# Patient Record
Sex: Male | Born: 1955 | Race: White | Hispanic: No | Marital: Married | Smoking: Current every day smoker
Health system: Southern US, Community
[De-identification: ages and names within clinical notes are randomized; demographics above are authoritative.]

## PROBLEM LIST (undated history)

## (undated) DIAGNOSIS — I10 Essential (primary) hypertension: Secondary | ICD-10-CM

## (undated) DIAGNOSIS — W132XXA Fall from, out of or through roof, initial encounter: Secondary | ICD-10-CM

## (undated) DIAGNOSIS — M1990 Unspecified osteoarthritis, unspecified site: Secondary | ICD-10-CM

## (undated) DIAGNOSIS — T07XXXA Unspecified multiple injuries, initial encounter: Secondary | ICD-10-CM

## (undated) DIAGNOSIS — J449 Chronic obstructive pulmonary disease, unspecified: Secondary | ICD-10-CM

## (undated) DIAGNOSIS — S060X9A Concussion with loss of consciousness of unspecified duration, initial encounter: Secondary | ICD-10-CM

## (undated) DIAGNOSIS — I219 Acute myocardial infarction, unspecified: Secondary | ICD-10-CM

## (undated) HISTORY — PX: HERNIA REPAIR: SHX51

## (undated) HISTORY — PX: ABDOMINAL HERNIA REPAIR: SHX539

## (undated) HISTORY — PX: CORONARY ANGIOPLASTY WITH STENT PLACEMENT: SHX49

---

## 2017-03-22 DIAGNOSIS — I10 Essential (primary) hypertension: Secondary | ICD-10-CM | POA: Diagnosis not present

## 2017-03-22 DIAGNOSIS — R632 Polyphagia: Secondary | ICD-10-CM | POA: Diagnosis not present

## 2017-03-22 DIAGNOSIS — M539 Dorsopathy, unspecified: Secondary | ICD-10-CM | POA: Diagnosis not present

## 2017-03-22 DIAGNOSIS — M069 Rheumatoid arthritis, unspecified: Secondary | ICD-10-CM | POA: Diagnosis not present

## 2017-04-02 DIAGNOSIS — Z125 Encounter for screening for malignant neoplasm of prostate: Secondary | ICD-10-CM | POA: Diagnosis not present

## 2017-04-02 DIAGNOSIS — Z6821 Body mass index (BMI) 21.0-21.9, adult: Secondary | ICD-10-CM | POA: Diagnosis not present

## 2017-04-02 DIAGNOSIS — Z1211 Encounter for screening for malignant neoplasm of colon: Secondary | ICD-10-CM | POA: Diagnosis not present

## 2017-04-02 DIAGNOSIS — Z13820 Encounter for screening for osteoporosis: Secondary | ICD-10-CM | POA: Diagnosis not present

## 2017-04-02 DIAGNOSIS — Z0001 Encounter for general adult medical examination with abnormal findings: Secondary | ICD-10-CM | POA: Diagnosis not present

## 2017-04-02 DIAGNOSIS — R5381 Other malaise: Secondary | ICD-10-CM | POA: Diagnosis not present

## 2017-04-02 DIAGNOSIS — I10 Essential (primary) hypertension: Secondary | ICD-10-CM | POA: Diagnosis not present

## 2017-04-11 DIAGNOSIS — K5900 Constipation, unspecified: Secondary | ICD-10-CM | POA: Diagnosis not present

## 2017-04-11 DIAGNOSIS — R1084 Generalized abdominal pain: Secondary | ICD-10-CM | POA: Diagnosis not present

## 2017-04-11 DIAGNOSIS — R252 Cramp and spasm: Secondary | ICD-10-CM | POA: Diagnosis not present

## 2017-04-11 DIAGNOSIS — B9681 Helicobacter pylori [H. pylori] as the cause of diseases classified elsewhere: Secondary | ICD-10-CM | POA: Diagnosis not present

## 2017-04-11 DIAGNOSIS — M539 Dorsopathy, unspecified: Secondary | ICD-10-CM | POA: Diagnosis not present

## 2017-04-11 DIAGNOSIS — K219 Gastro-esophageal reflux disease without esophagitis: Secondary | ICD-10-CM | POA: Diagnosis not present

## 2017-04-16 DIAGNOSIS — M539 Dorsopathy, unspecified: Secondary | ICD-10-CM | POA: Diagnosis not present

## 2017-04-16 DIAGNOSIS — G4700 Insomnia, unspecified: Secondary | ICD-10-CM | POA: Diagnosis not present

## 2017-04-16 DIAGNOSIS — R252 Cramp and spasm: Secondary | ICD-10-CM | POA: Diagnosis not present

## 2017-04-16 DIAGNOSIS — Z1331 Encounter for screening for depression: Secondary | ICD-10-CM | POA: Diagnosis not present

## 2017-04-23 DIAGNOSIS — M539 Dorsopathy, unspecified: Secondary | ICD-10-CM | POA: Diagnosis not present

## 2017-04-23 DIAGNOSIS — H6123 Impacted cerumen, bilateral: Secondary | ICD-10-CM | POA: Diagnosis not present

## 2017-04-23 DIAGNOSIS — Z1331 Encounter for screening for depression: Secondary | ICD-10-CM | POA: Diagnosis not present

## 2017-04-23 DIAGNOSIS — G4700 Insomnia, unspecified: Secondary | ICD-10-CM | POA: Diagnosis not present

## 2017-04-23 DIAGNOSIS — R252 Cramp and spasm: Secondary | ICD-10-CM | POA: Diagnosis not present

## 2017-04-23 DIAGNOSIS — H6122 Impacted cerumen, left ear: Secondary | ICD-10-CM | POA: Diagnosis not present

## 2017-05-01 DIAGNOSIS — E291 Testicular hypofunction: Secondary | ICD-10-CM | POA: Diagnosis not present

## 2017-05-01 DIAGNOSIS — I10 Essential (primary) hypertension: Secondary | ICD-10-CM | POA: Diagnosis not present

## 2017-05-01 DIAGNOSIS — N401 Enlarged prostate with lower urinary tract symptoms: Secondary | ICD-10-CM | POA: Diagnosis not present

## 2017-05-01 DIAGNOSIS — R252 Cramp and spasm: Secondary | ICD-10-CM | POA: Diagnosis not present

## 2017-05-01 DIAGNOSIS — M539 Dorsopathy, unspecified: Secondary | ICD-10-CM | POA: Diagnosis not present

## 2017-05-01 DIAGNOSIS — Z1331 Encounter for screening for depression: Secondary | ICD-10-CM | POA: Diagnosis not present

## 2017-05-14 DIAGNOSIS — R252 Cramp and spasm: Secondary | ICD-10-CM | POA: Diagnosis not present

## 2017-05-14 DIAGNOSIS — G4700 Insomnia, unspecified: Secondary | ICD-10-CM | POA: Diagnosis not present

## 2017-05-14 DIAGNOSIS — M539 Dorsopathy, unspecified: Secondary | ICD-10-CM | POA: Diagnosis not present

## 2017-05-14 DIAGNOSIS — N401 Enlarged prostate with lower urinary tract symptoms: Secondary | ICD-10-CM | POA: Diagnosis not present

## 2017-05-29 DIAGNOSIS — M79604 Pain in right leg: Secondary | ICD-10-CM | POA: Diagnosis not present

## 2017-05-29 DIAGNOSIS — G8929 Other chronic pain: Secondary | ICD-10-CM | POA: Diagnosis not present

## 2017-05-29 DIAGNOSIS — Z6820 Body mass index (BMI) 20.0-20.9, adult: Secondary | ICD-10-CM | POA: Diagnosis not present

## 2017-05-29 DIAGNOSIS — M539 Dorsopathy, unspecified: Secondary | ICD-10-CM | POA: Diagnosis not present

## 2017-06-19 DIAGNOSIS — F419 Anxiety disorder, unspecified: Secondary | ICD-10-CM | POA: Diagnosis not present

## 2017-06-25 DIAGNOSIS — F419 Anxiety disorder, unspecified: Secondary | ICD-10-CM | POA: Diagnosis not present

## 2017-06-26 DIAGNOSIS — I10 Essential (primary) hypertension: Secondary | ICD-10-CM | POA: Diagnosis not present

## 2017-06-26 DIAGNOSIS — G4700 Insomnia, unspecified: Secondary | ICD-10-CM | POA: Diagnosis not present

## 2017-06-26 DIAGNOSIS — Z1331 Encounter for screening for depression: Secondary | ICD-10-CM | POA: Diagnosis not present

## 2017-06-26 DIAGNOSIS — M79604 Pain in right leg: Secondary | ICD-10-CM | POA: Diagnosis not present

## 2017-06-26 DIAGNOSIS — E291 Testicular hypofunction: Secondary | ICD-10-CM | POA: Diagnosis not present

## 2017-06-26 DIAGNOSIS — M539 Dorsopathy, unspecified: Secondary | ICD-10-CM | POA: Diagnosis not present

## 2017-07-02 DIAGNOSIS — F419 Anxiety disorder, unspecified: Secondary | ICD-10-CM | POA: Diagnosis not present

## 2017-07-03 DIAGNOSIS — F419 Anxiety disorder, unspecified: Secondary | ICD-10-CM | POA: Diagnosis not present

## 2017-07-10 DIAGNOSIS — R05 Cough: Secondary | ICD-10-CM | POA: Diagnosis not present

## 2017-07-10 DIAGNOSIS — F17200 Nicotine dependence, unspecified, uncomplicated: Secondary | ICD-10-CM | POA: Diagnosis not present

## 2017-07-10 DIAGNOSIS — F419 Anxiety disorder, unspecified: Secondary | ICD-10-CM | POA: Diagnosis not present

## 2017-07-17 DIAGNOSIS — F419 Anxiety disorder, unspecified: Secondary | ICD-10-CM | POA: Diagnosis not present

## 2017-07-26 DIAGNOSIS — G4700 Insomnia, unspecified: Secondary | ICD-10-CM | POA: Diagnosis not present

## 2017-07-26 DIAGNOSIS — M539 Dorsopathy, unspecified: Secondary | ICD-10-CM | POA: Diagnosis not present

## 2017-07-26 DIAGNOSIS — G8929 Other chronic pain: Secondary | ICD-10-CM | POA: Diagnosis not present

## 2017-07-26 DIAGNOSIS — M79604 Pain in right leg: Secondary | ICD-10-CM | POA: Diagnosis not present

## 2017-08-27 DIAGNOSIS — Z1331 Encounter for screening for depression: Secondary | ICD-10-CM | POA: Diagnosis not present

## 2017-08-27 DIAGNOSIS — M545 Low back pain: Secondary | ICD-10-CM | POA: Diagnosis not present

## 2017-08-27 DIAGNOSIS — N401 Enlarged prostate with lower urinary tract symptoms: Secondary | ICD-10-CM | POA: Diagnosis not present

## 2017-08-27 DIAGNOSIS — R079 Chest pain, unspecified: Secondary | ICD-10-CM | POA: Diagnosis not present

## 2017-08-27 DIAGNOSIS — R252 Cramp and spasm: Secondary | ICD-10-CM | POA: Diagnosis not present

## 2017-09-25 DIAGNOSIS — Z681 Body mass index (BMI) 19 or less, adult: Secondary | ICD-10-CM | POA: Diagnosis not present

## 2017-09-25 DIAGNOSIS — R1110 Vomiting, unspecified: Secondary | ICD-10-CM | POA: Diagnosis not present

## 2017-09-25 DIAGNOSIS — R109 Unspecified abdominal pain: Secondary | ICD-10-CM | POA: Diagnosis not present

## 2017-09-25 DIAGNOSIS — R112 Nausea with vomiting, unspecified: Secondary | ICD-10-CM | POA: Diagnosis not present

## 2017-09-25 DIAGNOSIS — N401 Enlarged prostate with lower urinary tract symptoms: Secondary | ICD-10-CM | POA: Diagnosis not present

## 2017-09-25 DIAGNOSIS — R1011 Right upper quadrant pain: Secondary | ICD-10-CM | POA: Diagnosis not present

## 2017-09-25 DIAGNOSIS — M545 Low back pain: Secondary | ICD-10-CM | POA: Diagnosis not present

## 2017-09-27 DIAGNOSIS — R1011 Right upper quadrant pain: Secondary | ICD-10-CM | POA: Diagnosis not present

## 2017-09-27 DIAGNOSIS — R112 Nausea with vomiting, unspecified: Secondary | ICD-10-CM | POA: Diagnosis not present

## 2017-09-27 DIAGNOSIS — M545 Low back pain: Secondary | ICD-10-CM | POA: Diagnosis not present

## 2017-09-27 DIAGNOSIS — G8929 Other chronic pain: Secondary | ICD-10-CM | POA: Diagnosis not present

## 2017-09-27 DIAGNOSIS — Z23 Encounter for immunization: Secondary | ICD-10-CM | POA: Diagnosis not present

## 2017-10-22 DIAGNOSIS — R450 Nervousness: Secondary | ICD-10-CM | POA: Diagnosis not present

## 2017-10-22 DIAGNOSIS — Z681 Body mass index (BMI) 19 or less, adult: Secondary | ICD-10-CM | POA: Diagnosis not present

## 2017-10-22 DIAGNOSIS — G8929 Other chronic pain: Secondary | ICD-10-CM | POA: Diagnosis not present

## 2017-10-22 DIAGNOSIS — M545 Low back pain: Secondary | ICD-10-CM | POA: Diagnosis not present

## 2017-11-23 DIAGNOSIS — R450 Nervousness: Secondary | ICD-10-CM | POA: Diagnosis not present

## 2017-11-23 DIAGNOSIS — R300 Dysuria: Secondary | ICD-10-CM | POA: Diagnosis not present

## 2017-11-23 DIAGNOSIS — M545 Low back pain: Secondary | ICD-10-CM | POA: Diagnosis not present

## 2017-11-23 DIAGNOSIS — R197 Diarrhea, unspecified: Secondary | ICD-10-CM | POA: Diagnosis not present

## 2017-12-21 DIAGNOSIS — M545 Low back pain: Secondary | ICD-10-CM | POA: Diagnosis not present

## 2017-12-21 DIAGNOSIS — G8929 Other chronic pain: Secondary | ICD-10-CM | POA: Diagnosis not present

## 2017-12-21 DIAGNOSIS — Z6820 Body mass index (BMI) 20.0-20.9, adult: Secondary | ICD-10-CM | POA: Diagnosis not present

## 2017-12-21 DIAGNOSIS — I10 Essential (primary) hypertension: Secondary | ICD-10-CM | POA: Diagnosis not present

## 2017-12-28 DIAGNOSIS — Z681 Body mass index (BMI) 19 or less, adult: Secondary | ICD-10-CM | POA: Diagnosis not present

## 2017-12-28 DIAGNOSIS — I10 Essential (primary) hypertension: Secondary | ICD-10-CM | POA: Diagnosis not present

## 2017-12-28 DIAGNOSIS — G8929 Other chronic pain: Secondary | ICD-10-CM | POA: Diagnosis not present

## 2017-12-28 DIAGNOSIS — M545 Low back pain: Secondary | ICD-10-CM | POA: Diagnosis not present

## 2018-01-16 DIAGNOSIS — M545 Low back pain: Secondary | ICD-10-CM | POA: Diagnosis not present

## 2018-01-16 DIAGNOSIS — Z1331 Encounter for screening for depression: Secondary | ICD-10-CM | POA: Diagnosis not present

## 2018-01-16 DIAGNOSIS — I10 Essential (primary) hypertension: Secondary | ICD-10-CM | POA: Diagnosis not present

## 2018-01-16 DIAGNOSIS — G8929 Other chronic pain: Secondary | ICD-10-CM | POA: Diagnosis not present

## 2018-01-17 ENCOUNTER — Other Ambulatory Visit: Payer: Self-pay

## 2018-01-17 ENCOUNTER — Emergency Department (HOSPITAL_COMMUNITY): Payer: BLUE CROSS/BLUE SHIELD

## 2018-01-17 ENCOUNTER — Inpatient Hospital Stay (HOSPITAL_COMMUNITY): Payer: BLUE CROSS/BLUE SHIELD

## 2018-01-17 ENCOUNTER — Encounter (HOSPITAL_COMMUNITY): Payer: Self-pay | Admitting: Emergency Medicine

## 2018-01-17 ENCOUNTER — Inpatient Hospital Stay (HOSPITAL_COMMUNITY)
Admission: EM | Admit: 2018-01-17 | Discharge: 2018-01-26 | DRG: 958 | Disposition: A | Discharge status: Home Health | Payer: BLUE CROSS/BLUE SHIELD | Attending: Student | Admitting: Student

## 2018-01-17 DIAGNOSIS — M159 Polyosteoarthritis, unspecified: Secondary | ICD-10-CM | POA: Diagnosis not present

## 2018-01-17 DIAGNOSIS — R52 Pain, unspecified: Secondary | ICD-10-CM | POA: Diagnosis not present

## 2018-01-17 DIAGNOSIS — Z955 Presence of coronary angioplasty implant and graft: Secondary | ICD-10-CM

## 2018-01-17 DIAGNOSIS — S060XAA Concussion with loss of consciousness status unknown, initial encounter: Secondary | ICD-10-CM

## 2018-01-17 DIAGNOSIS — S32810A Multiple fractures of pelvis with stable disruption of pelvic ring, initial encounter for closed fracture: Secondary | ICD-10-CM | POA: Diagnosis present

## 2018-01-17 DIAGNOSIS — I2510 Atherosclerotic heart disease of native coronary artery without angina pectoris: Secondary | ICD-10-CM | POA: Diagnosis not present

## 2018-01-17 DIAGNOSIS — R05 Cough: Secondary | ICD-10-CM | POA: Diagnosis not present

## 2018-01-17 DIAGNOSIS — J939 Pneumothorax, unspecified: Secondary | ICD-10-CM | POA: Diagnosis not present

## 2018-01-17 DIAGNOSIS — Z9689 Presence of other specified functional implants: Secondary | ICD-10-CM

## 2018-01-17 DIAGNOSIS — I252 Old myocardial infarction: Secondary | ICD-10-CM | POA: Diagnosis not present

## 2018-01-17 DIAGNOSIS — M150 Primary generalized (osteo)arthritis: Secondary | ICD-10-CM | POA: Diagnosis present

## 2018-01-17 DIAGNOSIS — T07XXXA Unspecified multiple injuries, initial encounter: Secondary | ICD-10-CM | POA: Diagnosis not present

## 2018-01-17 DIAGNOSIS — S3210XA Unspecified fracture of sacrum, initial encounter for closed fracture: Secondary | ICD-10-CM | POA: Diagnosis not present

## 2018-01-17 DIAGNOSIS — W132XXA Fall from, out of or through roof, initial encounter: Secondary | ICD-10-CM | POA: Diagnosis present

## 2018-01-17 DIAGNOSIS — S2232XA Fracture of one rib, left side, initial encounter for closed fracture: Secondary | ICD-10-CM | POA: Diagnosis not present

## 2018-01-17 DIAGNOSIS — S2242XA Multiple fractures of ribs, left side, initial encounter for closed fracture: Secondary | ICD-10-CM | POA: Diagnosis not present

## 2018-01-17 DIAGNOSIS — S329XXA Fracture of unspecified parts of lumbosacral spine and pelvis, initial encounter for closed fracture: Secondary | ICD-10-CM

## 2018-01-17 DIAGNOSIS — S42125A Nondisplaced fracture of acromial process, left shoulder, initial encounter for closed fracture: Secondary | ICD-10-CM | POA: Diagnosis present

## 2018-01-17 DIAGNOSIS — F17210 Nicotine dependence, cigarettes, uncomplicated: Secondary | ICD-10-CM | POA: Diagnosis present

## 2018-01-17 DIAGNOSIS — Z634 Disappearance and death of family member: Secondary | ICD-10-CM | POA: Diagnosis not present

## 2018-01-17 DIAGNOSIS — Y92038 Other place in apartment as the place of occurrence of the external cause: Secondary | ICD-10-CM | POA: Diagnosis not present

## 2018-01-17 DIAGNOSIS — I10 Essential (primary) hypertension: Secondary | ICD-10-CM | POA: Diagnosis present

## 2018-01-17 DIAGNOSIS — S42122A Displaced fracture of acromial process, left shoulder, initial encounter for closed fracture: Secondary | ICD-10-CM | POA: Diagnosis not present

## 2018-01-17 DIAGNOSIS — S22019A Unspecified fracture of first thoracic vertebra, initial encounter for closed fracture: Secondary | ICD-10-CM | POA: Diagnosis not present

## 2018-01-17 DIAGNOSIS — Z79899 Other long term (current) drug therapy: Secondary | ICD-10-CM | POA: Diagnosis not present

## 2018-01-17 DIAGNOSIS — S060X9A Concussion with loss of consciousness of unspecified duration, initial encounter: Secondary | ICD-10-CM

## 2018-01-17 DIAGNOSIS — R4182 Altered mental status, unspecified: Secondary | ICD-10-CM | POA: Diagnosis not present

## 2018-01-17 DIAGNOSIS — S272XXA Traumatic hemopneumothorax, initial encounter: Secondary | ICD-10-CM | POA: Diagnosis not present

## 2018-01-17 DIAGNOSIS — S0990XA Unspecified injury of head, initial encounter: Secondary | ICD-10-CM | POA: Diagnosis not present

## 2018-01-17 DIAGNOSIS — E871 Hypo-osmolality and hyponatremia: Secondary | ICD-10-CM | POA: Diagnosis present

## 2018-01-17 DIAGNOSIS — E876 Hypokalemia: Secondary | ICD-10-CM | POA: Diagnosis not present

## 2018-01-17 DIAGNOSIS — Y990 Civilian activity done for income or pay: Secondary | ICD-10-CM | POA: Diagnosis not present

## 2018-01-17 DIAGNOSIS — S32502D Unspecified fracture of left pubis, subsequent encounter for fracture with routine healing: Secondary | ICD-10-CM | POA: Diagnosis not present

## 2018-01-17 DIAGNOSIS — S3993XA Unspecified injury of pelvis, initial encounter: Secondary | ICD-10-CM | POA: Diagnosis not present

## 2018-01-17 DIAGNOSIS — K219 Gastro-esophageal reflux disease without esophagitis: Secondary | ICD-10-CM | POA: Diagnosis present

## 2018-01-17 DIAGNOSIS — T1490XA Injury, unspecified, initial encounter: Secondary | ICD-10-CM

## 2018-01-17 DIAGNOSIS — Z4682 Encounter for fitting and adjustment of non-vascular catheter: Secondary | ICD-10-CM | POA: Diagnosis not present

## 2018-01-17 DIAGNOSIS — D72829 Elevated white blood cell count, unspecified: Secondary | ICD-10-CM | POA: Diagnosis not present

## 2018-01-17 DIAGNOSIS — R079 Chest pain, unspecified: Secondary | ICD-10-CM | POA: Diagnosis not present

## 2018-01-17 DIAGNOSIS — R918 Other nonspecific abnormal finding of lung field: Secondary | ICD-10-CM | POA: Diagnosis not present

## 2018-01-17 DIAGNOSIS — S199XXA Unspecified injury of neck, initial encounter: Secondary | ICD-10-CM | POA: Diagnosis not present

## 2018-01-17 DIAGNOSIS — S32502A Unspecified fracture of left pubis, initial encounter for closed fracture: Secondary | ICD-10-CM | POA: Diagnosis not present

## 2018-01-17 DIAGNOSIS — S42102A Fracture of unspecified part of scapula, left shoulder, initial encounter for closed fracture: Secondary | ICD-10-CM | POA: Diagnosis not present

## 2018-01-17 DIAGNOSIS — S270XXA Traumatic pneumothorax, initial encounter: Secondary | ICD-10-CM | POA: Diagnosis not present

## 2018-01-17 DIAGNOSIS — M549 Dorsalgia, unspecified: Secondary | ICD-10-CM | POA: Diagnosis present

## 2018-01-17 DIAGNOSIS — R0602 Shortness of breath: Secondary | ICD-10-CM

## 2018-01-17 DIAGNOSIS — J449 Chronic obstructive pulmonary disease, unspecified: Secondary | ICD-10-CM | POA: Diagnosis present

## 2018-01-17 DIAGNOSIS — R0781 Pleurodynia: Secondary | ICD-10-CM | POA: Diagnosis not present

## 2018-01-17 DIAGNOSIS — R404 Transient alteration of awareness: Secondary | ICD-10-CM | POA: Diagnosis not present

## 2018-01-17 DIAGNOSIS — S32811A Multiple fractures of pelvis with unstable disruption of pelvic ring, initial encounter for closed fracture: Secondary | ICD-10-CM | POA: Diagnosis not present

## 2018-01-17 DIAGNOSIS — J984 Other disorders of lung: Secondary | ICD-10-CM | POA: Diagnosis not present

## 2018-01-17 HISTORY — DX: Concussion with loss of consciousness of unspecified duration, initial encounter: S06.0X9A

## 2018-01-17 HISTORY — DX: Fall from, out of or through roof, initial encounter: W13.2XXA

## 2018-01-17 HISTORY — PX: CHEST TUBE INSERTION: SHX231

## 2018-01-17 HISTORY — DX: Essential (primary) hypertension: I10

## 2018-01-17 HISTORY — DX: Unspecified osteoarthritis, unspecified site: M19.90

## 2018-01-17 HISTORY — DX: Unspecified multiple injuries, initial encounter: T07.XXXA

## 2018-01-17 HISTORY — DX: Acute myocardial infarction, unspecified: I21.9

## 2018-01-17 HISTORY — DX: Chronic obstructive pulmonary disease, unspecified: J44.9

## 2018-01-17 HISTORY — DX: Concussion with loss of consciousness status unknown, initial encounter: S06.0XAA

## 2018-01-17 LAB — I-STAT CG4 LACTIC ACID, ED: Lactic Acid, Venous: 2.02 mmol/L (ref 0.5–1.9)

## 2018-01-17 LAB — COMPREHENSIVE METABOLIC PANEL
ALT: 71 U/L — ABNORMAL HIGH (ref 0–44)
AST: 114 U/L — ABNORMAL HIGH (ref 15–41)
Albumin: 3.3 g/dL — ABNORMAL LOW (ref 3.5–5.0)
Alkaline Phosphatase: 222 U/L — ABNORMAL HIGH (ref 38–126)
Anion gap: 12 (ref 5–15)
BUN: 8 mg/dL (ref 8–23)
CO2: 23 mmol/L (ref 22–32)
Calcium: 8.8 mg/dL — ABNORMAL LOW (ref 8.9–10.3)
Chloride: 95 mmol/L — ABNORMAL LOW (ref 98–111)
Creatinine, Ser: 0.84 mg/dL (ref 0.61–1.24)
GFR calc Af Amer: >60 mL/min (ref >60)
GFR calc non Af Amer: >60 mL/min (ref >60)
Glucose, Bld: 111 mg/dL — ABNORMAL HIGH (ref 70–99)
Potassium: 3.3 mmol/L — ABNORMAL LOW (ref 3.5–5.1)
Sodium: 130 mmol/L — ABNORMAL LOW (ref 135–145)
Total Bilirubin: 0.3 mg/dL (ref 0.3–1.2)
Total Protein: 6.8 g/dL (ref 6.5–8.1)

## 2018-01-17 LAB — I-STAT CHEM 8, ED
BUN: 10 mg/dL (ref 8–23)
Calcium, Ion: 1.06 mmol/L — ABNORMAL LOW (ref 1.15–1.40)
Chloride: 95 mmol/L — ABNORMAL LOW (ref 98–111)
Creatinine, Ser: 0.8 mg/dL (ref 0.61–1.24)
Glucose, Bld: 109 mg/dL — ABNORMAL HIGH (ref 70–99)
HCT: 44 % (ref 39.0–52.0)
Hemoglobin: 15 g/dL (ref 13.0–17.0)
Potassium: 3.3 mmol/L — ABNORMAL LOW (ref 3.5–5.1)
Sodium: 131 mmol/L — ABNORMAL LOW (ref 135–145)
TCO2: 26 mmol/L (ref 22–32)

## 2018-01-17 LAB — URINALYSIS, ROUTINE W REFLEX MICROSCOPIC
Bacteria, UA: NONE SEEN
Bilirubin Urine: NEGATIVE
Glucose, UA: NEGATIVE mg/dL
Ketones, ur: NEGATIVE mg/dL
Leukocytes, UA: NEGATIVE
Nitrite: NEGATIVE
Protein, ur: NEGATIVE mg/dL
Specific Gravity, Urine: 1.02 (ref 1.005–1.030)
pH: 7 (ref 5.0–8.0)

## 2018-01-17 LAB — CBC
HCT: 41.4 % (ref 39.0–52.0)
Hemoglobin: 13.6 g/dL (ref 13.0–17.0)
MCH: 31.1 pg (ref 26.0–34.0)
MCHC: 32.9 g/dL (ref 30.0–36.0)
MCV: 94.5 fL (ref 80.0–100.0)
Platelets: 396 10*3/uL (ref 150–400)
RBC: 4.38 MIL/uL (ref 4.22–5.81)
RDW: 13.9 % (ref 11.5–15.5)
WBC: 29 10*3/uL — ABNORMAL HIGH (ref 4.0–10.5)
nRBC: 0 % (ref 0.0–0.2)

## 2018-01-17 LAB — ABO/RH: ABO/RH(D): A POS

## 2018-01-17 LAB — TYPE AND SCREEN
ABO/RH(D): A POS
Antibody Screen: NEGATIVE

## 2018-01-17 LAB — PROTIME-INR
INR: 0.94
Prothrombin Time: 12.5 seconds (ref 11.4–15.2)

## 2018-01-17 LAB — ETHANOL: Alcohol, Ethyl (B): <10 mg/dL (ref <10)

## 2018-01-17 LAB — MRSA PCR SCREENING: MRSA by PCR: NEGATIVE

## 2018-01-17 LAB — CDS SEROLOGY

## 2018-01-17 MED ORDER — SERTRALINE HCL 50 MG PO TABS
25.0000 mg | ORAL_TABLET | Freq: Every morning | ORAL | Status: DC
  Administered 2018-01-18 – 2018-01-26 (×9): 25 mg via ORAL
  Filled 2018-01-17 (×9): qty 1

## 2018-01-17 MED ORDER — FENTANYL CITRATE (PF) 100 MCG/2ML IJ SOLN
INTRAMUSCULAR | Status: AC
  Filled 2018-01-17: qty 2

## 2018-01-17 MED ORDER — SODIUM CHLORIDE 0.9 % IV SOLN
INTRAVENOUS | Status: AC | PRN
  Administered 2018-01-17: 1000 mL via INTRAVENOUS

## 2018-01-17 MED ORDER — PANTOPRAZOLE SODIUM 40 MG IV SOLR
40.0000 mg | Freq: Every day | INTRAVENOUS | Status: DC
  Administered 2018-01-17: 40 mg via INTRAVENOUS
  Filled 2018-01-17: qty 40

## 2018-01-17 MED ORDER — LIDOCAINE HCL 1 % IJ SOLN: 20.0000 mL | Freq: Once | INTRAMUSCULAR | Status: DC

## 2018-01-17 MED ORDER — FENTANYL CITRATE (PF) 100 MCG/2ML IJ SOLN
INTRAMUSCULAR | Status: AC | PRN
  Administered 2018-01-17 (×3): 50 ug via INTRAVENOUS

## 2018-01-17 MED ORDER — ONDANSETRON HCL 4 MG/2ML IJ SOLN: 4.0000 mg | Freq: Four times a day (QID) | INTRAMUSCULAR | Status: DC | PRN

## 2018-01-17 MED ORDER — ENOXAPARIN SODIUM 40 MG/0.4ML ~~LOC~~ SOLN
40.0000 mg | SUBCUTANEOUS | Status: DC
  Administered 2018-01-18 – 2018-01-22 (×5): 40 mg via SUBCUTANEOUS
  Filled 2018-01-17 (×5): qty 0.4

## 2018-01-17 MED ORDER — FENTANYL CITRATE (PF) 100 MCG/2ML IJ SOLN
50.0000 ug | INTRAMUSCULAR | Status: DC | PRN
  Administered 2018-01-17: 50 ug via INTRAVENOUS
  Filled 2018-01-17: qty 2

## 2018-01-17 MED ORDER — IOHEXOL 300 MG/ML  SOLN
100.0000 mL | Freq: Once | INTRAMUSCULAR | Status: AC | PRN
  Administered 2018-01-17: 100 mL via INTRAVENOUS

## 2018-01-17 MED ORDER — POTASSIUM CHLORIDE CRYS ER 20 MEQ PO TBCR
20.0000 meq | EXTENDED_RELEASE_TABLET | Freq: Two times a day (BID) | ORAL | Status: AC
  Administered 2018-01-17 – 2018-01-18 (×3): 20 meq via ORAL
  Filled 2018-01-17 (×3): qty 1

## 2018-01-17 MED ORDER — AMLODIPINE BESYLATE 10 MG PO TABS
10.0000 mg | ORAL_TABLET | Freq: Every day | ORAL | Status: DC
  Administered 2018-01-18 – 2018-01-26 (×9): 10 mg via ORAL
  Filled 2018-01-17 (×2): qty 1
  Filled 2018-01-17: qty 2
  Filled 2018-01-17: qty 1
  Filled 2018-01-17 (×3): qty 2
  Filled 2018-01-17: qty 1
  Filled 2018-01-17: qty 2

## 2018-01-17 MED ORDER — METOPROLOL TARTRATE 5 MG/5ML IV SOLN: 5.0000 mg | Freq: Four times a day (QID) | INTRAVENOUS | Status: DC | PRN

## 2018-01-17 MED ORDER — FENTANYL CITRATE (PF) 100 MCG/2ML IJ SOLN
INTRAMUSCULAR | Status: AC | PRN
  Administered 2018-01-17: 50 ug via INTRAVENOUS

## 2018-01-17 MED ORDER — MORPHINE SULFATE (PF) 2 MG/ML IV SOLN
1.0000 mg | INTRAVENOUS | Status: DC | PRN
  Administered 2018-01-17 (×2): 2 mg via INTRAVENOUS
  Administered 2018-01-18 (×2): 4 mg via INTRAVENOUS
  Filled 2018-01-17: qty 2
  Filled 2018-01-17 (×2): qty 1
  Filled 2018-01-17: qty 2

## 2018-01-17 MED ORDER — HYDROCHLOROTHIAZIDE 25 MG PO TABS: 25.0000 mg | ORAL_TABLET | Freq: Every morning | ORAL | Status: DC

## 2018-01-17 MED ORDER — ACETAMINOPHEN 325 MG PO TABS
650.0000 mg | ORAL_TABLET | ORAL | Status: DC | PRN
  Administered 2018-01-17 – 2018-01-19 (×5): 650 mg via ORAL
  Filled 2018-01-17 (×5): qty 2

## 2018-01-17 MED ORDER — ONDANSETRON 4 MG PO TBDP: 4.0000 mg | ORAL_TABLET | Freq: Four times a day (QID) | ORAL | Status: DC | PRN

## 2018-01-17 MED ORDER — OXYCODONE HCL 5 MG PO TABS
5.0000 mg | ORAL_TABLET | ORAL | Status: DC | PRN
  Administered 2018-01-17 – 2018-01-18 (×4): 10 mg via ORAL
  Filled 2018-01-17 (×4): qty 2

## 2018-01-17 MED ORDER — ATENOLOL 25 MG PO TABS
25.0000 mg | ORAL_TABLET | Freq: Every day | ORAL | Status: DC
  Administered 2018-01-18 – 2018-01-26 (×9): 25 mg via ORAL
  Filled 2018-01-17 (×9): qty 1

## 2018-01-17 MED ORDER — METHOCARBAMOL 500 MG PO TABS
500.0000 mg | ORAL_TABLET | Freq: Four times a day (QID) | ORAL | Status: DC | PRN
  Administered 2018-01-17 – 2018-01-19 (×4): 500 mg via ORAL
  Filled 2018-01-17 (×5): qty 1

## 2018-01-17 MED ORDER — PANTOPRAZOLE SODIUM 40 MG PO TBEC
40.0000 mg | DELAYED_RELEASE_TABLET | Freq: Every day | ORAL | Status: DC
  Administered 2018-01-18 – 2018-01-26 (×9): 40 mg via ORAL
  Filled 2018-01-17 (×9): qty 1

## 2018-01-17 MED ORDER — TRAZODONE HCL 50 MG PO TABS
50.0000 mg | ORAL_TABLET | Freq: Every evening | ORAL | Status: DC | PRN
  Administered 2018-01-17: 50 mg via ORAL
  Filled 2018-01-17 (×2): qty 1

## 2018-01-17 MED ORDER — TAMSULOSIN HCL 0.4 MG PO CAPS
0.4000 mg | ORAL_CAPSULE | Freq: Every day | ORAL | Status: DC
  Administered 2018-01-17 – 2018-01-25 (×8): 0.4 mg via ORAL
  Filled 2018-01-17 (×8): qty 1

## 2018-01-17 MED ORDER — SODIUM CHLORIDE 0.9 % IV SOLN
INTRAVENOUS | Status: DC
  Administered 2018-01-17 – 2018-01-19 (×4): via INTRAVENOUS

## 2018-01-17 MED ORDER — DOCUSATE SODIUM 100 MG PO CAPS
100.0000 mg | ORAL_CAPSULE | Freq: Two times a day (BID) | ORAL | Status: DC
  Administered 2018-01-17 – 2018-01-25 (×16): 100 mg via ORAL
  Filled 2018-01-17 (×17): qty 1

## 2018-01-17 NOTE — Progress Notes (Signed)
Orthopedic Tech Progress Note Patient Details:  Bryce Moore 1955/03/24 [id number]  Ortho Devices Type of Ortho Device: Sling immobilizer Ortho Device/Splint Location: lue Ortho Device/Splint Interventions: Ordered, Application, Adjustment   Post Interventions Patient Tolerated: Well Instructions Provided: Care of device, Adjustment of device   Trinna Post 01/17/2018, 4:06 PM

## 2018-01-17 NOTE — Progress Notes (Signed)
Responded to level 2 page to support staff and and patient.  Patient experienced Fall.  Pt is alert and communicating with staff and not accessible.  Per EMS patient wife was called. Pt was not at home when fall took place. Will continue support as needed.   Bryce Moore, Bryce Moore, [place], [facility], Pager [phone]

## 2018-01-17 NOTE — ED Notes (Addendum)
Cash and check book locked in security. Necklace, clothing and phone remain with pt

## 2018-01-17 NOTE — ED Notes (Signed)
Report given to the floor. 

## 2018-01-17 NOTE — H&P (Signed)
[facility]  Bryce Moore 1955-07-28  [id number].    Requesting MD: Mesner Chief Complaint/Reason for Consult: level 2 trauma - fall from roof  HPI:  Patient is a 63 year old male brought in as a level 2 trauma brought in via EMS after fall from a roof. 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 Patient reports he remembers falling and +LOC. Reports he remembers standing up and his feet starting to slide out from under him. Denies dizziness or chest pain prior to fall. He complains mostly of left chest pain on arrival and then found to have some left shoulder and pelvic pain as well. PMH significant for HTN and CAD with 2 cardiac stents. Patient initially thought to be on blood thinning medications, but confirmed with patient and patient's wife that he is not on any blood thinners. NKDA. Drinks socially. Smokes 1 ppd for 20-25 years. Denies illicit drug use.   Of note it appears in chart review patient also had a mechanical fall back in May in [place], [place]. Patient denies history of other falls.  ROS: Review of Systems  HENT: Negative for hearing loss and tinnitus.   Eyes: Negative for blurred vision and double vision.  Respiratory: Positive for shortness of breath.   Cardiovascular: Positive for chest pain (L chest wall).  Gastrointestinal: Negative for abdominal pain, nausea and vomiting.  Musculoskeletal: Positive for joint pain (L shoulder and L hip). Negative for back pain and neck pain.  Neurological: Positive for loss of consciousness and headaches. Negative for tingling and sensory change.    No family history on file.  Past Medical History:  Diagnosis Date  . Hypertension     Past Surgical History:  Procedure Laterality Date  . CARDIAC CATHETERIZATION     2 stents    Social History:  has no history on file for tobacco, alcohol, and drug.  Allergies:  Allergies not on file  (Not in a hospital admission)   Blood pressure (S) 100/68, pulse 66, temperature 97.7 F (36.5 C), temperature source Temporal, resp. rate 16, height 5' 10.5" (1.791 m), weight 63 kg, SpO2 (S) 93 %. Physical Exam: Physical Exam Constitutional:      General: He is not in acute distress.    Appearance: He is well-developed and normal weight. He is not toxic-appearing.     Interventions: Cervical collar and nasal cannula in place.  HENT:     Head: Normocephalic and atraumatic. No raccoon eyes, Battle's sign, contusion or laceration.     Right Ear: Tympanic membrane, ear canal and external ear normal.     Left Ear: Tympanic membrane, ear canal and external ear normal.     Nose: Nose normal.     Mouth/Throat:     Lips: Pink.     Mouth: Mucous membranes are moist.     Dentition: Has dentures.  Eyes:     General: Lids are normal. No scleral icterus.    Extraocular Movements: Extraocular movements intact.     Conjunctiva/sclera: Conjunctivae normal.     Pupils: Pupils are equal, round, and reactive to light.  Neck:     Musculoskeletal: Neck supple.     Trachea: Phonation normal.  Cardiovascular:     Rate and Rhythm: Normal rate and regular rhythm.     Pulses:          Radial pulses are 2+ on the right side  and 2+ on the left side.       Dorsalis pedis pulses are 1+ on the right side and 1+ on the left side.  Pulmonary:     Effort: Pulmonary effort is normal.     Breath sounds: Normal breath sounds.     Comments: Chest tube present to Left chest with minimal bloody drainage.  Chest:     Chest wall: Tenderness present. No lacerations or crepitus.  Abdominal:     General: Abdomen is flat. Bowel sounds are normal. There is no distension.     Palpations: Abdomen is soft.     Tenderness: There is no abdominal tenderness.     Hernia: No hernia is present.  Genitourinary:    Penis: Normal.      Scrotum/Testes: Normal.  Musculoskeletal:     Comments: TTP of L  shoulder and L hip - ROM of both limited by pain, no ecchymoses or obvious deformity on gross examination  Skin:    General: Skin is warm and dry.     Findings: No abrasion, bruising or laceration.  Neurological:     Mental Status: He is alert and oriented to person, place, and time.     GCS: GCS eye subscore is 4. GCS verbal subscore is 5. GCS motor subscore is 6.     Sensory: Sensation is intact.     Motor: Motor function is intact.  Psychiatric:        Mood and Affect: Mood and affect normal.        Speech: Speech normal.        Behavior: Behavior is cooperative.     Results for orders placed or performed during the hospital encounter of 01/17/18 (from the past 48 hour(s))  I-Stat Chem 8, ED     Status: Abnormal   Collection Time: 01/17/18  1:19 PM  Result Value Ref Range   Sodium 131 (L) 135 - 145 mmol/L   Potassium 3.3 (L) 3.5 - 5.1 mmol/L   Chloride 95 (L) 98 - 111 mmol/L   BUN 10 8 - 23 mg/dL   Creatinine, Ser 2.35 0.61 - 1.24 mg/dL   Glucose, Bld 361 (H) 70 - 99 mg/dL   Calcium, Ion 4.43 (L) 1.15 - 1.40 mmol/L   TCO2 26 22 - 32 mmol/L   Hemoglobin 15.0 13.0 - 17.0 g/dL   HCT 15.4 00.8 - 67.6 %  I-Stat CG4 Lactic Acid, ED     Status: Abnormal   Collection Time: 01/17/18  1:19 PM  Result Value Ref Range   Lactic Acid, Venous 2.02 (HH) 0.5 - 1.9 mmol/L   Comment NOTIFIED PHYSICIAN    Dg Pelvis Portable  Result Date: 01/17/2018 CLINICAL DATA:  Unwitnessed fall from roof, left chest and left shoulder pain EXAM: PORTABLE PELVIS 1-2 VIEWS COMPARISON:  None. FINDINGS: There is slightly displaced fractures of the left pubic ramus post superiorly and inferiorly. There is some disruption of the symphysis pubis on the left. Both hips are normally position with no acute hip fracture. The right ramus appears intact. The SI joints appear corticated. The sacral foramina are unremarkable. IMPRESSION: 1. Comminuted fractures of the left pubic ramus. 2. No other acute fracture.  Electronically Signed   By: Dwyane Dee M.D.   On: 01/17/2018 13:34   Dg Chest Port 1 View  Result Date: 01/17/2018 CLINICAL DATA:  Unwitnessed fall from roof, left-sided chest pain, left shoulder pain EXAM: PORTABLE CHEST 1 VIEW COMPARISON:  Chest x-ray of 07/10/2016 FINDINGS: There  is a left pneumothorax present of approximately 20%. There appear to be a rib fractures involving several lateral ribs possibly the left fourth, fifth, sixth, seventh and eighth ribs although these are not well visualized on the frontal view. There is left chest wall subcutaneous air present. The right lung is clear. Mild cardiomegaly is stable. Old right fifth rib fracture is noted posteriorly. IMPRESSION: 1. Left pneumothorax or possibly 20% with associated left rib fractures probably involving the left fourth through eighth ribs. Left rib detail may be helpful if CT of the chest is not to be performed. 2.      The right lung is clear. 3.      Stable cardiomegaly. Electronically Signed   By: Dwyane DeePaul  Barry M.D.   On: 01/17/2018 13:33   Dg Humerus Left  Result Date: 01/17/2018 CLINICAL DATA:  Unwitnessed fall from roof, left-sided chest and shoulder pain EXAM: LEFT HUMERUS - 2+ VIEW COMPARISON:  None. FINDINGS: There does appear to be a fracture of the acromion of the left shoulder with slight widening of the [facility] joint. The acromion is also somewhat downward sloping. Left humeral head is intact and normally position and the left humerus appears intact. Subcutaneous left chest wall air is noted. The left pneumothorax again is noted. IMPRESSION: 1. Nondisplaced fracture of the left acromion with slightly widened left AC joint. CT may be helpful to assess further if warranted. 2. No other fracture is seen. 3. Left pneumothorax is noted with left chest wall subcutaneous air. Adjacent left rib fractures are not as well seen. Electronically Signed   By: Dwyane DeePaul  Barry M.D.   On: 01/17/2018 13:36      Assessment/Plan Fall from  roof Concussion - SLP for cognitive eval L T1 TVP fx - no back pain, PT/OT L rib fractures with HPTX - s/p chest tube placement in ED, pain control, IS, pulm toilet LC2 pelvic fracture - per ortho, TDWB; if severe pain with mobilization may require surgical fixation L shoulder fracture - per ortho, sling, NWB LUE Hyperbilirubinemia - tbili normal, AST/ALT mildly elevated, ALP 222 Hyponatremia - 130, NS ordered, repeat in AM HTN - prn lopressor Hx of CAD s/p stent x2  FEN: reg diet, IVF VTE: SCDs, can start lovenox tomorrow ID: no abx indicated at this time  Wells GuilesKelly Rayburn, [facility] 01/17/2018, 1:48 PM Pager: [phone]

## 2018-01-17 NOTE — ED Notes (Signed)
Help get patient undress on the monitor did manual blood pressure patient is resting with nurses and doctors at bedside

## 2018-01-17 NOTE — Consult Note (Signed)
Reason for Consult:Polytrauma Referring Physician: J Mesner  Atilano InaDanny R Moore is an 63 y.o. male.  HPI: Bryce Moore apparently fell from a roof or ladder. He was last seen on the roof (2nd story) and was later found wandering on the ground confused and covered in brush. He was brought in as a level 2 trauma activation c/o chest pain. When asked he admitted to left shoulder pain. He was not aware of what had happened or where he was.  Past Medical History:  Diagnosis Date  . Hypertension     Past Surgical History:  Procedure Laterality Date  . CARDIAC CATHETERIZATION     2 stents    No family history on file.  Social History:  has no history on file for tobacco, alcohol, and drug.  Allergies: Allergies not on file  Medications: I have reviewed the patient's current medications.  Results for orders placed or performed during the hospital encounter of 01/17/18 (from the past 48 hour(s))  I-Stat Chem 8, ED     Status: Abnormal   Collection Time: 01/17/18  1:19 PM  Result Value Ref Range   Sodium 131 (L) 135 - 145 mmol/L   Potassium 3.3 (L) 3.5 - 5.1 mmol/L   Chloride 95 (L) 98 - 111 mmol/L   BUN 10 8 - 23 mg/dL   Creatinine, Ser 4.690.80 0.61 - 1.24 mg/dL   Glucose, Bld 629109 (H) 70 - 99 mg/dL   Calcium, Ion 5.281.06 (L) 1.15 - 1.40 mmol/L   TCO2 26 22 - 32 mmol/L   Hemoglobin 15.0 13.0 - 17.0 g/dL   HCT 41.344.0 24.439.0 - 01.052.0 %  I-Stat CG4 Lactic Acid, ED     Status: Abnormal   Collection Time: 01/17/18  1:19 PM  Result Value Ref Range   Lactic Acid, Venous 2.02 (HH) 0.5 - 1.9 mmol/L   Comment NOTIFIED PHYSICIAN   CBC     Status: Abnormal   Collection Time: 01/17/18  1:33 PM  Result Value Ref Range   WBC 29.0 (H) 4.0 - 10.5 K/uL   RBC 4.38 4.22 - 5.81 MIL/uL   Hemoglobin 13.6 13.0 - 17.0 g/dL   HCT 27.241.4 53.639.0 - 64.452.0 %   MCV 94.5 80.0 - 100.0 fL   MCH 31.1 26.0 - 34.0 pg   MCHC 32.9 30.0 - 36.0 g/dL   RDW 03.413.9 74.211.5 - 59.515.5 %   Platelets 396 150 - 400 K/uL   nRBC 0.0 0.0 - 0.2 %    Comment:  Performed at [facility], [address] [place], [place] [id number]    Dg Pelvis Portable  Result Date: 01/17/2018 CLINICAL DATA:  Unwitnessed fall from roof, left chest and left shoulder pain EXAM: PORTABLE PELVIS 1-2 VIEWS COMPARISON:  None. FINDINGS: There is slightly displaced fractures of the left pubic ramus post superiorly and inferiorly. There is some disruption of the symphysis pubis on the left. Both hips are normally position with no acute hip fracture. The right ramus appears intact. The SI joints appear corticated. The sacral foramina are unremarkable. IMPRESSION: 1. Comminuted fractures of the left pubic ramus. 2. No other acute fracture. Electronically Signed   By: Dwyane DeePaul  Barry M.D.   On: 01/17/2018 13:34   Dg Chest Port 1 View  Result Date: 01/17/2018 CLINICAL DATA:  Unwitnessed fall from roof, left-sided chest pain, left shoulder pain EXAM: PORTABLE CHEST 1 VIEW COMPARISON:  Chest x-ray of 07/10/2016 FINDINGS: There is a left pneumothorax present of approximately 20%. There appear to be  a rib fractures involving several lateral ribs possibly the left fourth, fifth, sixth, seventh and eighth ribs although these are not well visualized on the frontal view. There is left chest wall subcutaneous air present. The right lung is clear. Mild cardiomegaly is stable. Old right fifth rib fracture is noted posteriorly. IMPRESSION: 1. Left pneumothorax or possibly 20% with associated left rib fractures probably involving the left fourth through eighth ribs. Left rib detail may be helpful if CT of the chest is not to be performed. 2.      The right lung is clear. 3.      Stable cardiomegaly. Electronically Signed   By: Dwyane Dee M.D.   On: 01/17/2018 13:33   Dg Humerus Left  Result Date: 01/17/2018 CLINICAL DATA:  Unwitnessed fall from roof, left-sided chest and shoulder pain EXAM: LEFT HUMERUS - 2+ VIEW COMPARISON:  None. FINDINGS: There does appear to be a fracture of the acromion of the left  shoulder with slight widening of the [employer] [employer] [facility] joint. The acromion is also somewhat downward sloping. Left humeral head is intact and normally position and the left humerus appears intact. Subcutaneous left chest wall air is noted. The left pneumothorax again is noted. IMPRESSION: 1. Nondisplaced fracture of the left acromion with slightly widened left AC joint. CT may be helpful to assess further if warranted. 2. No other fracture is seen. 3. Left pneumothorax is noted with left chest wall subcutaneous air. Adjacent left rib fractures are not as well seen. Electronically Signed   By: Dwyane Dee M.D.   On: 01/17/2018 13:36    Review of Systems  Unable to perform ROS: Mental status change  Cardiovascular: Positive for chest pain.  Musculoskeletal: Positive for joint pain (Left shoulder).   Blood pressure (S) 100/68, pulse 66, temperature 97.7 F (36.5 C), temperature source Temporal, resp. rate 16, height 5' 10.5" (1.791 m), weight 63 kg, SpO2 (S) 93 %. Physical Exam  Constitutional: He appears well-developed and well-nourished. No distress.  HENT:  Head: Normocephalic and atraumatic.  Eyes: Conjunctivae are normal. Right eye exhibits no discharge. Left eye exhibits no discharge. No scleral icterus.  Neck: Normal range of motion.  Cardiovascular: Normal rate and regular rhythm.  Respiratory: Effort normal. No respiratory distress.  Musculoskeletal:     Comments: Right shoulder, elbow, wrist, digits- no skin wounds, nontender, no instability, no blocks to motion  Sens  Ax/R/M/U intact  Mot   Ax/ R/ PIN/ M/ AIN/ U intact  Rad 2+  Left shoulder, elbow, wrist, digits- no skin wounds, TTP shoulder, no instability, no blocks to motion  Sens  Ax/R/M/U intact  Mot   Ax/ R/ PIN/ M/ AIN/ U intact  Rad 2+  Pelvis--no traumatic wounds or rash, no ecchymosis, stable to manual stress, TTP AP compression  BLE No traumatic wounds, ecchymosis, or rash  Nontender  No knee or ankle effusion  Knee stable to  varus/ valgus and anterior/posterior stress  Sens DPN, SPN, TN intact  Motor EHL, ext, flex, evers 5/5  DP 1+, PT 1+, No significant edema  Neurological: He is alert.  Skin: Skin is warm and dry. He is not diaphoretic.  Psychiatric: He has a normal mood and affect. His behavior is normal.    Assessment/Plan: Fall Left shoulder fx -- Sling, NWB LC2 pelvic fx -- Plan TDWB LLE. If pt has severe pain with mobilization may need SI screw fixation. Mult left rib fxs w/HPTX Concussion Multiple medical problems -- per trauma service  Freeman Caldron, PA-C Orthopedic Surgery [phone] 01/17/2018, 1:51 PM

## 2018-01-18 ENCOUNTER — Inpatient Hospital Stay (HOSPITAL_COMMUNITY): Payer: BLUE CROSS/BLUE SHIELD

## 2018-01-18 LAB — COMPREHENSIVE METABOLIC PANEL
ALT: 60 U/L — ABNORMAL HIGH (ref 0–44)
AST: 76 U/L — ABNORMAL HIGH (ref 15–41)
Albumin: 3.2 g/dL — ABNORMAL LOW (ref 3.5–5.0)
Alkaline Phosphatase: 198 U/L — ABNORMAL HIGH (ref 38–126)
Anion gap: 11 (ref 5–15)
BUN: 5 mg/dL — ABNORMAL LOW (ref 8–23)
CO2: 22 mmol/L (ref 22–32)
Calcium: 8.5 mg/dL — ABNORMAL LOW (ref 8.9–10.3)
Chloride: 99 mmol/L (ref 98–111)
Creatinine, Ser: 0.73 mg/dL (ref 0.61–1.24)
GFR calc Af Amer: >60 mL/min (ref >60)
GFR calc non Af Amer: >60 mL/min (ref >60)
Glucose, Bld: 123 mg/dL — ABNORMAL HIGH (ref 70–99)
Potassium: 3.5 mmol/L (ref 3.5–5.1)
Sodium: 132 mmol/L — ABNORMAL LOW (ref 135–145)
Total Bilirubin: 0.8 mg/dL (ref 0.3–1.2)
Total Protein: 6.5 g/dL (ref 6.5–8.1)

## 2018-01-18 LAB — CBC
HCT: 37.8 % — ABNORMAL LOW (ref 39.0–52.0)
Hemoglobin: 13 g/dL (ref 13.0–17.0)
MCH: 31.4 pg (ref 26.0–34.0)
MCHC: 34.4 g/dL (ref 30.0–36.0)
MCV: 91.3 fL (ref 80.0–100.0)
Platelets: 400 10*3/uL (ref 150–400)
RBC: 4.14 MIL/uL — ABNORMAL LOW (ref 4.22–5.81)
RDW: 13.6 % (ref 11.5–15.5)
WBC: 20.5 10*3/uL — ABNORMAL HIGH (ref 4.0–10.5)
nRBC: 0 % (ref 0.0–0.2)

## 2018-01-18 MED ORDER — TRAZODONE HCL 50 MG PO TABS
25.0000 mg | ORAL_TABLET | Freq: Every day | ORAL | Status: DC
  Administered 2018-01-18 – 2018-01-23 (×6): 25 mg via ORAL
  Filled 2018-01-18 (×6): qty 1

## 2018-01-18 MED ORDER — HYDROMORPHONE HCL 1 MG/ML IJ SOLN
0.5000 mg | INTRAMUSCULAR | Status: DC | PRN
  Administered 2018-01-18 – 2018-01-23 (×23): 1 mg via INTRAVENOUS
  Filled 2018-01-18 (×24): qty 1

## 2018-01-18 MED ORDER — TRAMADOL HCL 50 MG PO TABS
50.0000 mg | ORAL_TABLET | Freq: Four times a day (QID) | ORAL | Status: DC
  Administered 2018-01-18 – 2018-01-26 (×32): 50 mg via ORAL
  Filled 2018-01-18 (×32): qty 1

## 2018-01-18 MED ORDER — OXYCODONE HCL 5 MG PO TABS
10.0000 mg | ORAL_TABLET | ORAL | Status: DC | PRN
  Administered 2018-01-18 – 2018-01-26 (×36): 15 mg via ORAL
  Filled 2018-01-18 (×37): qty 3

## 2018-01-18 NOTE — Evaluation (Signed)
Physical Therapy Evaluation Patient Details Name: Bryce Moore MRN: [id number] DOB: 04-05-1955 Today's Date: 01/18/2018   History of Present Illness  Pt is a 63 yo male s/p fall from roof sustaining L shoulder fx, NWB in sling; L C2 pelvic fx TDWB; Left T1 TVP fx, but does not report back pain; and rib pain. Pt has no recollection of fall. PMHx: HTN    Clinical Impression  Bryce Moore admitted with the above listed diagnosis. Patient reports IND with all mobility and ADLs prior to admission. Patient today requiring Max A +2 for mobility - required squat pivot/lateral transfer with consistent cueing for weight bearing status as patient needed to be in drop arm recliner for more efficient and safe transfers. Patient with limited compliance of WB restrictions with tendency to try and use UE/LE to assist with mobility. Education to staff on transfer and WB status. Will recommend CIR level therapies at discharge as patient was IND prior to admission and with good motivation to return to PLOF. PT to continue to follow.      CIR;Supervision/Assistance - 24 hour    Equipment Recommendations  Wheelchair (measurements PT)    Recommendations for Other Services Rehab consult     Precautions / Restrictions Precautions Precautions: Fall;Other (comment)(rib fxs - chest tube) Required Braces or Orthoses: Sling(LUE) Restrictions Weight Bearing Restrictions: Yes LUE Weight Bearing: Non weight bearing LLE Weight Bearing: Touchdown weight bearing Other Position/Activity Restrictions: Pt not able to abide by LLE NWB      Mobility  Bed Mobility Overal bed mobility: Needs Assistance             General bed mobility comments: in recliner upon arrival  Transfers Overall transfer level: Needs assistance Equipment used: 1 person hand held assist(1 guiding wires; 2nd  assisting with RUE; 3rd assistin trunk) Transfers: Lateral/Scoot Transfers;Squat Pivot Transfers Sit to Stand: Max assist;+2 physical  assistance;+2 safety/equipment   Squat pivot transfers: Max assist;+2 physical assistance;+2 safety/equipment    Lateral/Scoot Transfers: (will perform next sessions due to inability to TDWB on LLE) General transfer comment: Pt non compliant at this time with TDWB on LLE; lateral scoots the best way; had to perform squat pivot transfer to get in correct recliner with drop arm.  Ambulation/Gait             General Gait Details: deferred  Stairs            Wheelchair Mobility    Modified Rankin (Stroke Patients Only)       Balance Overall balance assessment: Needs assistance   Sitting balance-Leahy Scale: Fair       Standing balance-Leahy Scale: Poor                               Pertinent Vitals/Pain Pain Assessment: 0-10 Pain Score: 8  Pain Location: ribs and chest Pain Descriptors / Indicators: Crushing Pain Intervention(s): Limited activity within patient's tolerance;Monitored during session;Repositioned;Premedicated before session    Home Living Family/patient expects to be discharged to:: Private residence Living Arrangements: Spouse/significant other Available Help at Discharge: Family;Available PRN/intermittently(family works throughout the day) Type of Home: House Home Access: Stairs to enter Entrance Stairs-Rails: Can reach both Secretary/administrator of Steps: 6(steep; less than in the back of the house, but hard to getto) Home Layout: One level Home Equipment: Walker - 2 wheels Additional Comments: family stated a ramp could be built in future; steps to get into high bed  Prior Function Level of Independence: Independent(working)               Hand Dominance   Dominant Hand: Right    Extremity/Trunk Assessment   Upper Extremity Assessment Upper Extremity Assessment: Defer to OT evaluation LUE Deficits / Details: in sling L humeral fx LUE: Unable to fully assess due to pain LUE Sensation: WNL LUE Coordination:  decreased gross motor(in sling; pt elbow, wrist and hand, WFLs)    Lower Extremity Assessment Lower Extremity Assessment: Generalized weakness;LLE deficits/detail LLE Deficits / Details: TDWB on LLE; quads and hamstrings, intact- pain with movement LLE: Unable to fully assess due to pain LLE Sensation: WNL    Cervical / Trunk Assessment Cervical / Trunk Assessment: Normal  Communication   Communication: No difficulties  Cognition Arousal/Alertness: Awake/alert Behavior During Therapy: Restless Overall Cognitive Status: Within Functional Limits for tasks assessed                                        General Comments General comments (skin integrity, edema, etc.): leans forward to brace for rib/chest pain    Exercises     Assessment/Plan    PT Assessment Patient needs continued PT services  PT Problem List Decreased strength;Decreased activity tolerance;Decreased balance;Decreased mobility;Decreased knowledge of use of DME;Decreased safety awareness       PT Treatment Interventions DME instruction;Functional mobility training;Therapeutic activities;Therapeutic exercise;Balance training;Neuromuscular re-education;Patient/family education;Wheelchair mobility training    PT Goals (Current goals can be found in the Care Plan section)  Acute Rehab PT Goals Patient Stated Goal: to get back to work PT Goal Formulation: With patient Time For Goal Achievement: 02/01/18 Potential to Achieve Goals: Good    Frequency Min 3X/week   Barriers to discharge Inaccessible home environment;Decreased caregiver support      Co-evaluation PT/OT/SLP Co-Evaluation/Treatment: Yes Reason for Co-Treatment: Complexity of the patient's impairments (multi-system involvement);Necessary to address cognition/behavior during functional activity;For patient/therapist safety;To address functional/ADL transfers PT goals addressed during session: Mobility/safety with mobility;Balance OT  goals addressed during session: ADL's and self-care;Proper use of Adaptive equipment and DME;Strengthening/ROM       AM-PAC PT "6 Clicks" Mobility  Outcome Measure Help needed turning from your back to your side while in a flat bed without using bedrails?: A Lot Help needed moving from lying on your back to sitting on the side of a flat bed without using bedrails?: A Lot Help needed moving to and from a bed to a chair (including a wheelchair)?: A Lot Help needed standing up from a chair using your arms (e.g., wheelchair or bedside chair)?: Total Help needed to walk in hospital room?: Total Help needed climbing 3-5 steps with a railing? : Total 6 Click Score: 9    End of Session   Activity Tolerance: Patient tolerated treatment well;Patient limited by pain Patient left: in chair;with call bell/phone within reach;with chair alarm set;with family/visitor present Nurse Communication: Mobility status PT Visit Diagnosis: Unsteadiness on feet (R26.81);Other abnormalities of gait and mobility (R26.89);Muscle weakness (generalized) (M62.81);History of falling (Z91.81)    Time: 5974-1638 PT Time Calculation (min) (ACUTE ONLY): 34 min   Charges:   PT Evaluation $PT Eval Moderate Complexity: 1 Mod           Kipp Laurence, PT, DPT Supplemental Physical Therapist 01/18/18 11:29 AM Pager: [phone] Office: [phone]

## 2018-01-18 NOTE — Evaluation (Signed)
Occupational Therapy Evaluation Patient Details Name: Bryce Moore MRN: [id number] DOB: 1955/01/24 Today's Date: 01/18/2018    History of Present Illness Pt is a 63 yo male s/p fall from roof sustaining L shoulder fx, NWB in sling; L C2 pelvic fx TDWB; Left T1 TVP fx, but does not report back pain; and rib pain. Pt has no recollection of fall. PMHx: HTN   Clinical Impression   Pt is a 63 yo male s/p above dx s/p fall. Pt PTA: Independent and working. Pt lives with family- spouse and daughter lives nearby. Pt CLOF: Pt limited by NWB LUE in sling, elbow wrist and hand, AROM, WFLs; LLE TDWB and pt unable to perform wt bearing status at this time safely. Pt transferring from recliner with no drop arm to recliner with drop arm for lateral scoots with MaxA +2-3 for IV management and safety. Staff educated on lateral transfer. Pt with pain 8/10 at beginning of session and pain 6.5/10 to conclude session. Pt educated on weigh tbearing precautions and staff aware. Pt greatly limited by only intermittent assist at home, elevated bed at home w/ steps to get into bed, mobile home and inability to fit W/C through most doorways with 6 steps to enter home. Pt would benefit greatly from continued OT skilled services for ADL, mobility and safety in CIR setting.    Follow Up Recommendations  CIR;Supervision/Assistance - 24 hour    Equipment Recommendations  3 in 1 bedside commode;Wheelchair (measurements OT);Tub/shower seat    Recommendations for Other Services PT consult;Rehab consult(PT already ordered)     Precautions / Restrictions Precautions Precautions: Fall;Other (comment)(rib fxs) Required Braces or Orthoses: Sling(LUE) Restrictions Weight Bearing Restrictions: Yes LUE Weight Bearing: Non weight bearing(TDWB) LLE Weight Bearing: Touchdown weight bearing Other Position/Activity Restrictions: Pt not able to abide by LLE NWB      Mobility Bed Mobility Overal bed mobility: Needs Assistance              General bed mobility comments: in recliner upon arrival  Transfers Overall transfer level: Needs assistance Equipment used: 1 person hand held assist(1 guiding wires; 2nd  assisting with RUE; 3rd assistin trunk) Transfers: Sit to/from Stand;Lateral/Scoot Transfers Sit to Stand: Max assist;+2 physical assistance;+2 safety/equipment        Lateral/Scoot Transfers: (will perform next sessions due to inability to TDWB on LLE) General transfer comment: Pt non compliant at this time with TDWB on LLE; lateral scoots the best way; had to perform stand pivot transfer to get in correct recliner with drop arm.    Balance Overall balance assessment: Needs assistance   Sitting balance-Leahy Scale: Fair       Standing balance-Leahy Scale: Poor                             ADL either performed or assessed with clinical judgement   ADL Overall ADL's : Needs assistance/impaired Eating/Feeding: Set up   Grooming: Set up;Sitting   Upper Body Bathing: Minimal assistance;Sitting   Lower Body Bathing: Maximal assistance;Sitting/lateral leans;Bed level   Upper Body Dressing : Minimal assistance;Moderate assistance;Sitting;Bed level   Lower Body Dressing: Maximal assistance;Cueing for safety;Sitting/lateral leans;Bed level   Toilet Transfer: Transfer board;Maximal assistance;+2 for physical assistance;+2 for safety/equipment;BSC;Requires drop arm   Toileting- Clothing Manipulation and Hygiene: Maximal assistance;Total assistance;+2 for physical assistance;+2 for safety/equipment;Sit to/from stand       Functional mobility during ADLs: Maximal assistance;+2 for physical assistance;+2 for safety/equipment;Cueing for safety;Cueing for  sequencing(cues for stand pivot transfer; ) General ADL Comments: Min to moDA for UB ADL; MaxA LB ADL- ribs and chest severe pain     Vision Baseline Vision/History: No visual deficits Vision Assessment?: No apparent visual deficits      Perception     Praxis      Pertinent Vitals/Pain Pain Assessment: 0-10 Pain Score: 8  Pain Location: ribs and chest Pain Descriptors / Indicators: Crushing     Hand Dominance Right   Extremity/Trunk Assessment Upper Extremity Assessment Upper Extremity Assessment: Generalized weakness;LUE deficits/detail LUE Deficits / Details: in sling L humeral fx LUE: Unable to fully assess due to pain LUE Sensation: WNL LUE Coordination: decreased gross motor(in sling; pt elbow, wrist and hand, WFLs)   Lower Extremity Assessment Lower Extremity Assessment: Defer to PT evaluation;Generalized weakness;LLE deficits/detail LLE Deficits / Details: TDWB on LLE; quads and hamstrings, intact- pain with movement LLE: Unable to fully assess due to pain   Cervical / Trunk Assessment Cervical / Trunk Assessment: Normal   Communication Communication Communication: No difficulties   Cognition Arousal/Alertness: Awake/alert Behavior During Therapy: Restless Overall Cognitive Status: Within Functional Limits for tasks assessed                                     General Comments  leans forward due to chest and rib pain    Exercises     Shoulder Instructions      Home Living Family/patient expects to be discharged to:: Private residence Living Arrangements: Spouse/significant other Available Help at Discharge: Family;Available PRN/intermittently(family works throughout the day) Type of Home: House Home Access: Stairs to enter Entergy CorporationEntrance Stairs-Number of Steps: 6(steep; less than in the back of the house, but hard to Eastman Kodakgetto) Entrance Stairs-Rails: Can reach both Home Layout: One level     Bathroom Shower/Tub: Producer, television/film/videoWalk-in shower   Bathroom Toilet: Standard     Home Equipment: Environmental consultantWalker - 2 wheels   Additional Comments: family stated a ramp could be built in future; steps to get into high bed      Prior Functioning/Environment Level of Independence: Independent(working)                  OT Problem List: Decreased strength;Decreased range of motion;Decreased activity tolerance;Impaired balance (sitting and/or standing);Decreased coordination;Decreased safety awareness;Pain;Increased edema      OT Treatment/Interventions: Self-care/ADL training;Therapeutic exercise;Energy conservation;DME and/or AE instruction;Therapeutic activities;Patient/family education;Balance training    OT Goals(Current goals can be found in the care plan section) Acute Rehab OT Goals Patient Stated Goal: to get back to work OT Goal Formulation: With patient Time For Goal Achievement: 02/01/18 Potential to Achieve Goals: Good  OT Frequency: Min 2X/week   Barriers to D/C: Inaccessible home environment;Decreased caregiver support(spouse works; daughter can check in)          Co-evaluation PT/OT/SLP Co-Evaluation/Treatment: Yes Reason for Co-Treatment: Complexity of the patient's impairments (multi-system involvement);Necessary to address cognition/behavior during functional activity;For patient/therapist safety;To address functional/ADL transfers   OT goals addressed during session: ADL's and self-care;Proper use of Adaptive equipment and DME;Strengthening/ROM      AM-PAC OT "6 Clicks" Daily Activity     Outcome Measure Help from another person eating meals?: A Little Help from another person taking care of personal grooming?: A Little Help from another person toileting, which includes using toliet, bedpan, or urinal?: A Lot Help from another person bathing (including washing, rinsing, drying)?: A Lot Help from another person  to put on and taking off regular upper body clothing?: A Little Help from another person to put on and taking off regular lower body clothing?: Total 6 Click Score: 14   End of Session Nurse Communication: Mobility status;Weight bearing status  Activity Tolerance: Patient limited by pain;Treatment limited secondary to agitation;Treatment limited  secondary to medical complications (Comment) Patient left: in chair;with call bell/phone within reach;with chair alarm set;with family/visitor present  OT Visit Diagnosis: Unsteadiness on feet (R26.81);Muscle weakness (generalized) (M62.81);Other abnormalities of gait and mobility (R26.89);Pain Pain - Right/Left: Left Pain - part of body: Shoulder;Leg                Time: 7035-0093 OT Time Calculation (min): 34 min Charges:  OT General Charges $OT Visit: 1 Visit OT Evaluation $OT Eval Moderate Complexity: 1 Mod OT Treatments $Neuromuscular Re-education: 8-22 mins  Revonda Standard Cecil Cranker) Glendell Docker OTR/L Acute Rehabilitation Services Pager: [phone] Office: [phone]   Sandrea Hughs 01/18/2018, 10:18 AM

## 2018-01-18 NOTE — Progress Notes (Addendum)
[facility]/Trauma Progress Note      Assessment/Plan Fall from roof Concussion - SLP for cognitive eval L T1 TVP fx - no back pain, PT/OT L rib fractures with HPTX - s/p CT, pain control, IS, pulm toilet, CXR pending LC2 pelvic fracture - per ortho, TDWB; if severe pain with mobilization may require surgical fixation L shoulder fracture - per ortho, sling, NWB LUE Elevated LFT's - tbili normal, AST/ALT mildly elevated, ALP 222, labs pending Hyponatremia - 130, NS ordered HTN - home meds Hx of CAD s/p stent x2  FEN: reg diet, IVF VTE: SCDs, lovenox ID: no abx indicated at this time Follow up: Trauma, ortho Foley: none  Plan: PT/OT/Speech, pain control, pulm toilet, labs and CXR pending   LOS: 1 day    Subjective: CC: chest tube site, left hip pain  Pt states he did not sleep well last night due to pain. He has no new complaints. He denies cough, fever, numbness or tingling. No family at bedside  Objective: Vital signs in last 24 hours: Temp:  [97.7 F (36.5 C)-98.6 F (37 C)] 98.2 F (36.8 C) (01/10 0549) Pulse Rate:  [63-85] 77 (01/10 0549) Resp:  [15-25] 18 (01/10 0549) BP: (100-176)/(68-91) 167/83 (01/10 0549) SpO2:  [93 %-100 %] 96 % (01/10 0549) Weight:  [61.5 kg-63 kg] 61.5 kg (01/09 1723) Last BM Date: 01/16/18  Intake/Output from previous day: 01/09 0701 - 01/10 0700 In: 1683.9 [I.V.:1683.9] Out: 2325 [Urine:2325] Intake/Output this shift: No intake/output data recorded.  PE: Gen:  Alert, NAD, pleasant, cooperative Chest: chest tube site C/D/I, no air leak Card:  RRR, no M/G/R heard, 2 + radial pulses bilaterally Pulm:  CTA, diffuse rhonchi, rate and effort normal Extremities: moves all 4's, LUE in sling Neuro: A&O, no sensory deficits Skin: no rashes noted, warm and dry   Anti-infectives: Anti-infectives (From admission, onward)   None      Lab Results:  Recent Labs    01/17/18 1333 01/18/18 0537  WBC 29.0* 20.5*  HGB  13.6 13.0  HCT 41.4 37.8*  PLT 396 400   BMET Recent Labs    01/17/18 1319 01/17/18 1333  NA 131* 130*  K 3.3* 3.3*  CL 95* 95*  CO2  --  23  GLUCOSE 109* 111*  BUN 10 8  CREATININE 0.80 0.84  CALCIUM  --  8.8*   PT/INR Recent Labs    01/17/18 1333  LABPROT 12.5  INR 0.94   CMP     Component Value Date/Time   NA 130 (L) 01/17/2018 1333   K 3.3 (L) 01/17/2018 1333   CL 95 (L) 01/17/2018 1333   CO2 23 01/17/2018 1333   GLUCOSE 111 (H) 01/17/2018 1333   BUN 8 01/17/2018 1333   CREATININE 0.84 01/17/2018 1333   CALCIUM 8.8 (L) 01/17/2018 1333   PROT 6.8 01/17/2018 1333   ALBUMIN 3.3 (L) 01/17/2018 1333   AST 114 (H) 01/17/2018 1333   ALT 71 (H) 01/17/2018 1333   ALKPHOS 222 (H) 01/17/2018 1333   BILITOT 0.3 01/17/2018 1333   GFRNONAA >60 01/17/2018 1333   GFRAA >60 01/17/2018 1333   Lipase  No results found for: LIPASE  Studies/Results: Ct Head Wo Contrast  Result Date: 01/17/2018 CLINICAL DATA:  Trauma, head and neck injury EXAM: CT HEAD WITHOUT CONTRAST CT CERVICAL SPINE WITHOUT CONTRAST TECHNIQUE: Multidetector CT imaging of the head and cervical spine was performed following the standard protocol without intravenous contrast. Multiplanar CT image reconstructions of the cervical  spine were also generated. COMPARISON:  12/28/2015 FINDINGS: CT HEAD FINDINGS Brain: Patchy white matter microvascular changes bilaterally as before. No acute intracranial hemorrhage, mass lesion, definite new infarction, midline shift, herniation, hydrocephalus, or extra-axial fluid collection. No focal mass effect or edema. Remote left basal ganglia lacunar-type infarct. Cisterns are patent. No gross cerebellar abnormality. Remote right cerebellar infarct. Vascular: No hyperdense vessel or unexpected calcification. Skull: Normal. Negative for fracture or focal lesion. Sinuses/Orbits: No acute finding. Other: None. CT CERVICAL SPINE FINDINGS Alignment: Facets are aligned. No subluxation  dislocation. Trace anterolisthesis of C4 on C5. Trace retrolisthesis of C6 upon C7. These changes appear secondary to degenerative spondylosis and facet arthropathy. Skull base and vertebrae: No acute cervical spine fracture. Preserved vertebral body heights. No acute compression fracture, wedge-shaped deformity or focal kyphosis. There is a subtle acute nondisplaced fracture of the left transverse process at T1. T1 vertebral body appears intact. This is confirmed on the coronal reconstructions, image 29 series 11. Soft tissues and spinal canal: No prevertebral fluid or swelling. No visible canal hematoma. Disc levels: Moderate degenerative disc disease of the cervical spine, most pronounced at C4-C7. These levels demonstrate marked disc space narrowing, sclerosis and osteophytes. Degenerative changes of the C1-2 articulation. Upper chest: Small left subcutaneous emphysema apical pneumothorax. Left upper chest subcutaneous emphysema noted. Other: None. IMPRESSION: Stable chronic white matter microvascular changes. No acute intracranial abnormality by noncontrast CT. Similar cervical degenerative disc disease/spondylosis without malalignment. No acute cervical spine fracture. Subtle nondisplaced acute fracture of the left transverse process of T1 noted. Left apical pneumothorax and left chest subcutaneous emphysema. These results were called by telephone at the time of interpretation on 01/17/2018 at 3:13 pm to [facility] BeverlyKelly Rayburn, [facility], who verbally acknowledged these results. Electronically Signed   By: Judie PetitM.  Shick M.D.   On: 01/17/2018 15:14   Ct Chest W Contrast  Result Date: 01/17/2018 CLINICAL DATA:  Trauma, fall from roof EXAM: CT CHEST, ABDOMEN, AND PELVIS WITH CONTRAST TECHNIQUE: Multidetector CT imaging of the chest, abdomen and pelvis was performed following the standard protocol during bolus administration of intravenous contrast. CONTRAST:  100mL OMNIPAQUE IOHEXOL 300 MG/ML  SOLN COMPARISON:  01/17/2010  FINDINGS: CT CHEST FINDINGS Cardiovascular: Atherosclerotic changes of the major branch vessels and aorta. Negative for aneurysm or dissection. No mediastinal hemorrhage or hematoma. Normal heart size. Coronary atherosclerosis. No pericardial effusion. Mediastinum/Nodes: No enlarged mediastinal, hilar, or axillary lymph nodes. Thyroid gland, trachea, and esophagus demonstrate no significant findings. Lungs/Pleura: Left anterior pigtail chest tube noted. Small residual left apical and anterior pneumothorax, 10% or less. Background paraseptal emphysema changes noted. Dependent basilar atelectasis. No lobar collapse, significant consolidation, interstitial process, or edema. Musculoskeletal: Numerous acute lateral left rib fractures. Left chest subcutaneous emphysema noted. Left scapula demonstrates a peripheral scapular spine fracture at the base of the acromion. CT ABDOMEN PELVIS FINDINGS Hepatobiliary: No hepatic injury or perihepatic hematoma. Gallbladder is unremarkable Pancreas: Unremarkable. No pancreatic ductal dilatation or surrounding inflammatory changes. Spleen: No splenic injury or perisplenic hematoma. Adrenals/Urinary Tract: Stable adrenal nodularity, suspect adenoma on the left. No renal obstruction, hydronephrosis, acute perinephric inflammatory process, retroperitoneal hemorrhage or hematoma. No hydroureter or ureteral calculus. Bladder unremarkable. Stomach/Bowel: Negative for bowel obstruction, significant dilatation, ileus, or free air. No fluid collection or abscess. Appendix not visualized. Scattered colonic diverticulosis. Vascular/Lymphatic: Aortic atherosclerosis. Minor infrarenal aortic ectasia, maximal diameter 2.4 cm. Mesenteric and renal vasculature demonstrate atherosclerotic change but appear patent. Iliac atherosclerosis noted. No adenopathy. Reproductive: Prostate normal in size. Seminal vesicles are symmetric. No  acute finding CT. Other: No abdominal wall hernia or abnormality. No  abdominopelvic ascites. Musculoskeletal: Lower anterior pelvic small soft tissue hematoma in the prevesical space measuring approximately 7.4 x 3.5 cm, image 113 series 3. This is secondary to the acute minimally comminuted displaced midline left superior and inferior rami fractures. No associated midline diastasis. There are additional nondisplaced fractures of the left sacrum, image 100 series 3 and also the posterior left iliac wing, image 91 series 3. SI joints remains symmetric. Hips appear symmetric and intact. IMPRESSION: Small residual left apical and anterior pneumothorax. Left anterior chest tube noted. Numerous acute lateral left rib fractures with left chest subcutaneous emphysema Left scapula spine fracture at the base of the acromion No acute intra-abdominopelvic finding Acute nondisplaced fractures of the left sacrum and posterior left iliac wing Acute minimally comminuted displaced left superior and inferior rami fractures at the symphysis. Anterior pelvic hematoma in the prevesical space. These results were called by telephone at the time of interpretation on 01/17/2018 at 3:15 pm to Wells Guiles, Pac who verbally acknowledged these results. Electronically Signed   By: Judie Petit.  Shick M.D.   On: 01/17/2018 15:33   Ct Cervical Spine Wo Contrast  Result Date: 01/17/2018 CLINICAL DATA:  Trauma, head and neck injury EXAM: CT HEAD WITHOUT CONTRAST CT CERVICAL SPINE WITHOUT CONTRAST TECHNIQUE: Multidetector CT imaging of the head and cervical spine was performed following the standard protocol without intravenous contrast. Multiplanar CT image reconstructions of the cervical spine were also generated. COMPARISON:  12/28/2015 FINDINGS: CT HEAD FINDINGS Brain: Patchy white matter microvascular changes bilaterally as before. No acute intracranial hemorrhage, mass lesion, definite new infarction, midline shift, herniation, hydrocephalus, or extra-axial fluid collection. No focal mass effect or edema. Remote left  basal ganglia lacunar-type infarct. Cisterns are patent. No gross cerebellar abnormality. Remote right cerebellar infarct. Vascular: No hyperdense vessel or unexpected calcification. Skull: Normal. Negative for fracture or focal lesion. Sinuses/Orbits: No acute finding. Other: None. CT CERVICAL SPINE FINDINGS Alignment: Facets are aligned. No subluxation dislocation. Trace anterolisthesis of C4 on C5. Trace retrolisthesis of C6 upon C7. These changes appear secondary to degenerative spondylosis and facet arthropathy. Skull base and vertebrae: No acute cervical spine fracture. Preserved vertebral body heights. No acute compression fracture, wedge-shaped deformity or focal kyphosis. There is a subtle acute nondisplaced fracture of the left transverse process at T1. T1 vertebral body appears intact. This is confirmed on the coronal reconstructions, image 29 series 11. Soft tissues and spinal canal: No prevertebral fluid or swelling. No visible canal hematoma. Disc levels: Moderate degenerative disc disease of the cervical spine, most pronounced at C4-C7. These levels demonstrate marked disc space narrowing, sclerosis and osteophytes. Degenerative changes of the C1-2 articulation. Upper chest: Small left subcutaneous emphysema apical pneumothorax. Left upper chest subcutaneous emphysema noted. Other: None. IMPRESSION: Stable chronic white matter microvascular changes. No acute intracranial abnormality by noncontrast CT. Similar cervical degenerative disc disease/spondylosis without malalignment. No acute cervical spine fracture. Subtle nondisplaced acute fracture of the left transverse process of T1 noted. Left apical pneumothorax and left chest subcutaneous emphysema. These results were called by telephone at the time of interpretation on 01/17/2018 at 3:13 pm to [facility], [facility], who verbally acknowledged these results. Electronically Signed   By: Judie Petit.  Shick M.D.   On: 01/17/2018 15:14   Ct Abdomen Pelvis W  Contrast  Result Date: 01/17/2018 CLINICAL DATA:  Trauma, fall from roof EXAM: CT CHEST, ABDOMEN, AND PELVIS WITH CONTRAST TECHNIQUE: Multidetector CT imaging of the chest, abdomen  and pelvis was performed following the standard protocol during bolus administration of intravenous contrast. CONTRAST:  OMNIPAQUE IOHEXOL 300 MG/ML  SOLN COMPARISON:  01/17/2010 FINDINGS: CT CHEST FINDINGS Cardiovascular: Atherosclerotic changes of the major branch vessels and aorta. Negative for aneurysm or dissection. No mediastinal hemorrhage or hematoma. Normal heart size. Coronary atherosclerosis. No pericardial effusion. Mediastinum/Nodes: No enlarged mediastinal, hilar, or axillary lymph nodes. Thyroid gland, trachea, and esophagus demonstrate no significant findings. Lungs/Pleura: Left anterior pigtail chest tube noted. Small residual left apical and anterior pneumothorax, 10% or less. Background paraseptal emphysema changes noted. Dependent basilar atelectasis. No lobar collapse, significant consolidation, interstitial process, or edema. Musculoskeletal: Numerous acute lateral left rib fractures. Left chest subcutaneous emphysema noted. Left scapula demonstrates a peripheral scapular spine fracture at the base of the acromion. CT ABDOMEN PELVIS FINDINGS Hepatobiliary: No hepatic injury or perihepatic hematoma. Gallbladder is unremarkable Pancreas: Unremarkable. No pancreatic ductal dilatation or surrounding inflammatory changes. Spleen: No splenic injury or perisplenic hematoma. Adrenals/Urinary Tract: Stable adrenal nodularity, suspect adenoma on the left. No renal obstruction, hydronephrosis, acute perinephric inflammatory process, retroperitoneal hemorrhage or hematoma. No hydroureter or ureteral calculus. Bladder unremarkable. Stomach/Bowel: Negative for bowel obstruction, significant dilatation, ileus, or free air. No fluid collection or abscess. Appendix not visualized. Scattered colonic diverticulosis.  Vascular/Lymphatic: Aortic atherosclerosis. Minor infrarenal aortic ectasia, maximal diameter 2.4 cm. Mesenteric and renal vasculature demonstrate atherosclerotic change but appear patent. Iliac atherosclerosis noted. No adenopathy. Reproductive: Prostate normal in size. Seminal vesicles are symmetric. No acute finding CT. Other: No abdominal wall hernia or abnormality. No abdominopelvic ascites. Musculoskeletal: Lower anterior pelvic small soft tissue hematoma in the prevesical space measuring approximately 7.4 x 3.5 cm, image 113 series 3. This is secondary to the acute minimally comminuted displaced midline left superior and inferior rami fractures. No associated midline diastasis. There are additional nondisplaced fractures of the left sacrum, image 100 series 3 and also the posterior left iliac wing, image 91 series 3. SI joints remains symmetric. Hips appear symmetric and intact. IMPRESSION: Small residual left apical and anterior pneumothorax. Left anterior chest tube noted. Numerous acute lateral left rib fractures with left chest subcutaneous emphysema Left scapula spine fracture at the base of the acromion No acute intra-abdominopelvic finding Acute nondisplaced fractures of the left sacrum and posterior left iliac wing Acute minimally comminuted displaced left superior and inferior rami fractures at the symphysis. Anterior pelvic hematoma in the prevesical space. These results were called by telephone at the time of interpretation on 01/17/2018 at 3:15 pm to Wells Guiles, Pac who verbally acknowledged these results. Electronically Signed   By: Judie Petit.  Shick M.D.   On: 01/17/2018 15:33   Dg Pelvis Portable  Result Date: 01/17/2018 CLINICAL DATA:  Unwitnessed fall from roof, left chest and left shoulder pain EXAM: PORTABLE PELVIS 1-2 VIEWS COMPARISON:  None. FINDINGS: There is slightly displaced fractures of the left pubic ramus post superiorly and inferiorly. There is some disruption of the symphysis pubis  on the left. Both hips are normally position with no acute hip fracture. The right ramus appears intact. The SI joints appear corticated. The sacral foramina are unremarkable. IMPRESSION: 1. Comminuted fractures of the left pubic ramus. 2. No other acute fracture. Electronically Signed   By: Dwyane Dee M.D.   On: 01/17/2018 13:34   Dg Pelvis Comp Min 3v  Result Date: 01/17/2018 CLINICAL DATA:  Fall from roof today. Pelvic fractures on current CT. EXAM: JUDET PELVIS - 3+ VIEW COMPARISON:  CT, 01/17/2018 at 1442 hours FINDINGS: Left  parasymphyseal fractures are noted. There is a fracture across the left superior pubis and connecting pubic ramus and the inferior pubic ramus at its junction with the pubic symphysis. Fractures are mildly comminuted, displaced a maximum of 9 mm. No other visualized fractures. Fracture of the left sacrum noted on the current CT is not visualized radiographically. The SI joints, symphysis pubis and hip joints are normally spaced and aligned. Residual contrast is noted in the bladder. IMPRESSION: 1. Left parasymphyseal fractures as detailed above, mildly displaced, as noted on the current CT. 2. Left sacral fracture not visualized radiographically. 3. No other fractures.  Joints are normally aligned. Electronically Signed   By: Amie Portlandavid  Ormond M.D.   On: 01/17/2018 16:25   Dg Chest Portable 1 View  Result Date: 01/17/2018 CLINICAL DATA:  Status post chest tube placement. EXAM: PORTABLE CHEST 1 VIEW COMPARISON:  01/17/2018 FINDINGS: Cardiomediastinal silhouette is normal. Interval near complete resolution of left pneumothorax post placement of left chest tube with minimal residual in the lung apex and base. Small left pleural effusion. Significant soft tissue emphysema of the left chest wall. Rib fractures again noted. Osseous structures are without acute abnormality. Soft tissues are grossly normal. IMPRESSION: Minimal residual left pneumothorax, post chest tube placement. Small left  pleural effusion. Posttraumatic soft tissue emphysema of the left chest wall with associated left-sided rib fractures. Electronically Signed   By: Ted Mcalpineobrinka  Dimitrova M.D.   On: 01/17/2018 16:04   Dg Chest Port 1 View  Result Date: 01/17/2018 CLINICAL DATA:  Unwitnessed fall from roof, left-sided chest pain, left shoulder pain EXAM: PORTABLE CHEST 1 VIEW COMPARISON:  Chest x-ray of 07/10/2016 FINDINGS: There is a left pneumothorax present of approximately 20%. There appear to be a rib fractures involving several lateral ribs possibly the left fourth, fifth, sixth, seventh and eighth ribs although these are not well visualized on the frontal view. There is left chest wall subcutaneous air present. The right lung is clear. Mild cardiomegaly is stable. Old right fifth rib fracture is noted posteriorly. IMPRESSION: 1. Left pneumothorax or possibly 20% with associated left rib fractures probably involving the left fourth through eighth ribs. Left rib detail may be helpful if CT of the chest is not to be performed. 2.      The right lung is clear. 3.      Stable cardiomegaly. Electronically Signed   By: Dwyane DeePaul  Barry M.D.   On: 01/17/2018 13:33   Dg Humerus Left  Result Date: 01/17/2018 CLINICAL DATA:  Unwitnessed fall from roof, left-sided chest and shoulder pain EXAM: LEFT HUMERUS - 2+ VIEW COMPARISON:  None. FINDINGS: There does appear to be a fracture of the acromion of the left shoulder with slight widening of the [facility] LPC joint. The acromion is also somewhat downward sloping. Left humeral head is intact and normally position and the left humerus appears intact. Subcutaneous left chest wall air is noted. The left pneumothorax again is noted. IMPRESSION: 1. Nondisplaced fracture of the left acromion with slightly widened left AC joint. CT may be helpful to assess further if warranted. 2. No other fracture is seen. 3. Left pneumothorax is noted with left chest wall subcutaneous air. Adjacent left rib fractures are not as  well seen. Electronically Signed   By: Dwyane DeePaul  Barry M.D.   On: 01/17/2018 13:36      Jerre SimonJessica L Miyo Aina , [facility] LPA-C [facility] 01/18/2018, 7:31 AM  Pager: [phone] Mon-Wed, Friday 7:00am-4:30pm Thurs 7am-11:30am  Consults: [phone]

## 2018-01-18 NOTE — Evaluation (Signed)
Speech Language Pathology Evaluation Patient Details Name: Bryce Moore MRN: [id number] DOB: 1955/12/11 Today's Date: 01/18/2018 Time: 5427-0623 SLP Time Calculation (min) (ACUTE ONLY): 25 min  Problem List:  Patient Active Problem List   Diagnosis Date Noted  . Fall from roof 01/17/2018   Past Medical History:  Past Medical History:  Diagnosis Date  . Arthritis    "all over" (01/17/2018)  . Concussion 01/17/2018   Hattie Perch 01/17/2018  . COPD (chronic obstructive pulmonary disease) (HCC)   . Fall from roof 01/17/2018  . Hypertension   . Multiple fractures 01/17/2018   Left shoulder fx; LC2 pelvic fx ; Mult left rib fxs w/HPTX/notes 01/17/2018  . Myocardial infarction (HCC)    "I've had 2; several years back" (01/17/2018)   Past Surgical History:  Past Surgical History:  Procedure Laterality Date  . ABDOMINAL HERNIA REPAIR    . CHEST TUBE INSERTION Left 01/17/2018  . CORONARY ANGIOPLASTY WITH STENT PLACEMENT     "I have 2 stents" (01/17/2018)  . HERNIA REPAIR     HPI:  Pt is a 63 yo male s/p fall from roof sustaining L shoulder fx, NWB in sling; L C2 pelvic fx TDWB; Left T1 TVP fx, but does not report back pain; and rib pain. Pt has no recollection of fall. PMHx: HTN   Assessment / Plan / Recommendation Clinical Impression  Pt presents with mild difficulties related to impulsivity, selective attention, and problem-solving; however, some of these behaviors may be baseline according to patient and his wife.  Short-term memory/working memory was an area of strength.  There was fluctuating performance with attention during session, with pt able to filter extraneous auditory/visual distractions and recall new information at one point, alternating with moments of inattention and impulsivity.  These behaviors led to impaired problem-solving during paper/pen tasks, as pt would not allow adequate time to process instructions before initiating task. Discussed impact of concussion on cognition. Pt  denies change in function, but agrees that in current setting he has not been taxed with the demands of normal life. If plan is for CIR, pt may benefit from repeated assessment to determine improvements and potential benefit of SLP tx.     SLP Assessment  SLP Recommendation/Assessment: All further Speech Lanaguage Pathology  needs can be addressed in the next venue of care SLP Visit Diagnosis: Cognitive communication deficit (R41.841)    Follow Up Recommendations       Frequency and Duration           SLP Evaluation Cognition  Overall Cognitive Status: Impaired/Different from baseline Arousal/Alertness: Awake/alert Orientation Level: Oriented X4 Attention: Selective Selective Attention: Impaired Selective Attention Impairment: Verbal complex;Verbal basic Memory: Impaired Memory Impairment: Retrieval deficit Problem Solving: Impaired Problem Solving Impairment: Verbal basic Executive Function: Self Monitoring Self Monitoring: Impaired Self Monitoring Impairment: Verbal basic Behaviors: Impulsive       Comprehension  Auditory Comprehension Overall Auditory Comprehension: Appears within functional limits for tasks assessed Visual Recognition/Discrimination Discrimination: Within Function Limits Reading Comprehension Reading Status: Not tested    Expression Expression Primary Mode of Expression: Verbal Verbal Expression Overall Verbal Expression: Appears within functional limits for tasks assessed Written Expression Dominant Hand: Right Written Expression: Within Functional Limits   Oral / Motor  Oral Motor/Sensory Function Overall Oral Motor/Sensory Function: Within functional limits Motor Speech Overall Motor Speech: Appears within functional limits for tasks assessed   GO                    Azrielle Springsteen,  Marchelle Folks Laurice 01/18/2018, 12:38 PM   Everett Ehrler L. Samson Frederic, MA CCC/SLP Acute Rehabilitation Services Office number [phone] Pager [phone]
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
## 2018-01-18 NOTE — Progress Notes (Signed)
Occupational Therapy Treatment Patient Details Name: Bryce Moore Outland MRN: [id number] DOB: Feb 02, 1955 Today's Date: 01/18/2018    History of present illness Pt is a 63 yo male s/p fall from roof sustaining L shoulder fx, NWB in sling; L C2 pelvic fx TDWB; Left T1 TVP fx, but does not report back pain; and rib pain. Pt has no recollection of fall. PMHx: HTN   OT comments  Pt is a 63 yo male s/p fall for follow-up treatment for drop arm BSC transfer. Pt unable to tolerate lateral scooting abiding by wt bearing statuses on L side with maxA +2 with OTR and NT. NT explained that pt had been standing on Moore side with MAxA and bed pan placed underneath when up in chair for any BM. Pt feeling too sore to attempt sliding board today due to rib fxs. Pt tolerating session well and left pt in chair with family and visitors to come in. Pt willing to attempt transfers, but prefers sitting still leaning forward. Pt's pain remains 7/10. Pt would benefit from continued OT skilled services for ADL mobility and safety in CIR setting.   Follow Up Recommendations  CIR;Supervision/Assistance - 24 hour    Equipment Recommendations  3 in 1 bedside commode;Wheelchair (measurements OT);Tub/shower seat    Recommendations for Other Services PT consult;Rehab consult    Precautions / Restrictions Precautions Precautions: Fall Required Braces or Orthoses: Sling Restrictions Weight Bearing Restrictions: Yes LUE Weight Bearing: Non weight bearing LLE Weight Bearing: Touchdown weight bearing Other Position/Activity Restrictions: Pt not able to abide by LLE NWB       Mobility Bed Mobility               General bed mobility comments: in recliner upon arrival  Transfers Overall transfer level: Needs assistance Equipment used: (drop arm BSC) Transfers: Lateral/Scoot Transfers;Squat Pivot Transfers Sit to Stand: Max assist;+2 physical assistance;+2 safety/equipment   Squat pivot transfers: Max assist;+2 physical  assistance;+2 safety/equipment    Lateral/Scoot Transfers: +2 physical assistance;Max assist(was not efficient) General transfer comment: Pt non compliant at this time with TDWB on LLE; lateral scoots the best way; had to perform squat pivot transfer to get in correct recliner with drop arm.    Balance Overall balance assessment: Needs assistance   Sitting balance-Leahy Scale: Fair       Standing balance-Leahy Scale: Poor                             ADL either performed or assessed with clinical judgement   ADL Overall ADL's : Needs assistance/impaired                         Toilet Transfer: Squat-pivot(with lateral scooting; not able to perform with NWB)           Functional mobility during ADLs: Maximal assistance;+2 for physical assistance;+2 for safety/equipment;Cueing for safety;Cueing for sequencing General ADL Comments: Min to moDA for UB ADL; MaxA LB ADL- ribs and chest severe pain     Vision       Perception     Praxis      Cognition     Overall Cognitive Status: Impaired/Different from baseline                                          Exercises  Shoulder Instructions       General Comments      Pertinent Vitals/ Pain       Pain Assessment: 0-10 Pain Score: 6   Home Living                                          Prior Functioning/Environment              Frequency  Min 2X/week        Progress Toward Goals  OT Goals(current goals can now be found in the care plan section)  Progress towards OT goals: Progressing toward goals  Acute Rehab OT Goals Patient Stated Goal: to get back to work OT Goal Formulation: With patient Time For Goal Achievement: 02/01/18 Potential to Achieve Goals: Good ADL Goals Pt Will Perform Grooming: with modified independence;sitting Pt Will Perform Upper Body Dressing: with supervision Pt Will Perform Lower Body Dressing: with min  assist;bed level;sitting/lateral leans Pt Will Transfer to Toilet: with mod assist;with transfer board;bedside commode Pt/caregiver will Perform Home Exercise Program: Increased ROM;Both right and left upper extremity;With Supervision Additional ADL Goal #1: Pt will perform stand pivot or lateral slide transfers from one surface to another with ModA.  Plan Discharge plan remains appropriate    Co-evaluation                 AM-PAC OT "6 Clicks" Daily Activity     Outcome Measure   Help from another person eating meals?: A Little Help from another person taking care of personal grooming?: A Little Help from another person toileting, which includes using toliet, bedpan, or urinal?: A Lot Help from another person bathing (including washing, rinsing, drying)?: A Lot Help from another person to put on and taking off regular upper body clothing?: A Little Help from another person to put on and taking off regular lower body clothing?: Total 6 Click Score: 14    End of Session Equipment Utilized During Treatment: Rolling walker;Other (comment)(sling)  OT Visit Diagnosis: Unsteadiness on feet (R26.81);Muscle weakness (generalized) (M62.81);Other abnormalities of gait and mobility (R26.89);Pain Pain - Right/Left: Left Pain - part of body: Shoulder;Leg   Activity Tolerance Patient limited by pain;Treatment limited secondary to agitation;Treatment limited secondary to medical complications (Comment)   Patient Left in chair;with call bell/phone within reach;with chair alarm set;with family/visitor present   Nurse Communication Mobility status;Weight bearing status        Time: 1500-1520 OT Time Calculation (min): 20 min  Charges: OT General Charges $OT Visit: 1 Visit OT Treatments $Self Care/Home Management : 8-22 mins  Cristi Loron) Glendell Docker OTR/L Acute Rehabilitation Services Pager: [phone] Office: [phone]   Sandrea Hughs 01/18/2018, 3:36 PM

## 2018-01-19 ENCOUNTER — Inpatient Hospital Stay (HOSPITAL_COMMUNITY): Payer: BLUE CROSS/BLUE SHIELD

## 2018-01-19 DIAGNOSIS — I2510 Atherosclerotic heart disease of native coronary artery without angina pectoris: Secondary | ICD-10-CM

## 2018-01-19 DIAGNOSIS — T07XXXA Unspecified multiple injuries, initial encounter: Secondary | ICD-10-CM

## 2018-01-19 DIAGNOSIS — J449 Chronic obstructive pulmonary disease, unspecified: Secondary | ICD-10-CM

## 2018-01-19 DIAGNOSIS — J939 Pneumothorax, unspecified: Secondary | ICD-10-CM

## 2018-01-19 DIAGNOSIS — D72829 Elevated white blood cell count, unspecified: Secondary | ICD-10-CM

## 2018-01-19 DIAGNOSIS — M159 Polyosteoarthritis, unspecified: Secondary | ICD-10-CM

## 2018-01-19 DIAGNOSIS — I10 Essential (primary) hypertension: Secondary | ICD-10-CM

## 2018-01-19 DIAGNOSIS — W132XXA Fall from, out of or through roof, initial encounter: Secondary | ICD-10-CM

## 2018-01-19 DIAGNOSIS — E871 Hypo-osmolality and hyponatremia: Secondary | ICD-10-CM

## 2018-01-19 DIAGNOSIS — R52 Pain, unspecified: Secondary | ICD-10-CM

## 2018-01-19 LAB — CBC
HCT: 33.5 % — ABNORMAL LOW (ref 39.0–52.0)
Hemoglobin: 11 g/dL — ABNORMAL LOW (ref 13.0–17.0)
MCH: 30.5 pg (ref 26.0–34.0)
MCHC: 32.8 g/dL (ref 30.0–36.0)
MCV: 92.8 fL (ref 80.0–100.0)
Platelets: 328 10*3/uL (ref 150–400)
RBC: 3.61 MIL/uL — ABNORMAL LOW (ref 4.22–5.81)
RDW: 13.9 % (ref 11.5–15.5)
WBC: 26 10*3/uL — ABNORMAL HIGH (ref 4.0–10.5)
nRBC: 0 % (ref 0.0–0.2)

## 2018-01-19 LAB — COMPREHENSIVE METABOLIC PANEL
ALT: 43 U/L (ref 0–44)
AST: 35 U/L (ref 15–41)
Albumin: 2.9 g/dL — ABNORMAL LOW (ref 3.5–5.0)
Alkaline Phosphatase: 155 U/L — ABNORMAL HIGH (ref 38–126)
Anion gap: 9 (ref 5–15)
BUN: 12 mg/dL (ref 8–23)
CO2: 20 mmol/L — ABNORMAL LOW (ref 22–32)
Calcium: 8.4 mg/dL — ABNORMAL LOW (ref 8.9–10.3)
Chloride: 103 mmol/L (ref 98–111)
Creatinine, Ser: 0.75 mg/dL (ref 0.61–1.24)
GFR calc Af Amer: >60 mL/min (ref >60)
GFR calc non Af Amer: >60 mL/min (ref >60)
Glucose, Bld: 130 mg/dL — ABNORMAL HIGH (ref 70–99)
Potassium: 4.2 mmol/L (ref 3.5–5.1)
Sodium: 132 mmol/L — ABNORMAL LOW (ref 135–145)
Total Bilirubin: 0.5 mg/dL (ref 0.3–1.2)
Total Protein: 5.8 g/dL — ABNORMAL LOW (ref 6.5–8.1)

## 2018-01-19 MED ORDER — ACETAMINOPHEN 500 MG PO TABS
1000.0000 mg | ORAL_TABLET | Freq: Three times a day (TID) | ORAL | Status: DC
  Administered 2018-01-19 – 2018-01-26 (×20): 1000 mg via ORAL
  Filled 2018-01-19 (×20): qty 2

## 2018-01-19 MED ORDER — METHOCARBAMOL 750 MG PO TABS
750.0000 mg | ORAL_TABLET | Freq: Three times a day (TID) | ORAL | Status: DC
  Administered 2018-01-19 – 2018-01-26 (×20): 750 mg via ORAL
  Filled 2018-01-19 (×20): qty 1

## 2018-01-19 MED ORDER — POLYETHYLENE GLYCOL 3350 17 G PO PACK
17.0000 g | PACK | Freq: Once | ORAL | Status: AC
  Administered 2018-01-19: 17 g via ORAL
  Filled 2018-01-19: qty 1

## 2018-01-19 MED ORDER — POLYETHYLENE GLYCOL 3350 17 GM/SCOOP PO POWD: 1.0000 | Freq: Once | ORAL | Status: DC

## 2018-01-19 NOTE — Plan of Care (Signed)
  Problem: Education: Goal: Knowledge of General Education information will improve Description Including pain rating scale, medication(s)/side effects and non-pharmacologic comfort measures Outcome: Progressing   

## 2018-01-19 NOTE — Progress Notes (Signed)
Subjective/Chief Complaint: Complains of pain left chest wall   Objective: Vital signs in last 24 hours: Temp:  [98.1 F (36.7 C)-98.4 F (36.9 C)] 98.4 F (36.9 C) (01/11 0446) Pulse Rate:  [78-81] 80 (01/11 0446) Resp:  [16-20] 20 (01/11 0446) BP: (111-144)/(80-94) 129/80 (01/11 0446) SpO2:  [86 %-97 %] 97 % (01/11 0446) Last BM Date: 01/16/18  Intake/Output from previous day: 01/10 0701 - 01/11 0700 In: 1124 [P.O.:1124] Out: 1400 [Urine:1400] Intake/Output this shift: No intake/output data recorded.  General appearance: alert and cooperative Resp: rhonchi bilaterally Cardio: regular rate and rhythm GI: soft, non-tender; bowel sounds normal; no masses,  no organomegaly  Lab Results:  Recent Labs    01/18/18 0537 01/19/18 0516  WBC 20.5* 26.0*  HGB 13.0 11.0*  HCT 37.8* 33.5*  PLT 400 328   BMET Recent Labs    01/18/18 0537 01/19/18 0516  NA 132* 132*  K 3.5 4.2  CL 99 103  CO2 22 20*  GLUCOSE 123* 130*  BUN 5* 12  CREATININE 0.73 0.75  CALCIUM 8.5* 8.4*   PT/INR Recent Labs    01/17/18 1333  LABPROT 12.5  INR 0.94   ABG No results for input(s): PHART, HCO3 in the last 72 hours.  Invalid input(s): PCO2, PO2  Studies/Results: Ct Head Wo Contrast  Result Date: 01/17/2018 CLINICAL DATA:  Trauma, head and neck injury EXAM: CT HEAD WITHOUT CONTRAST CT CERVICAL SPINE WITHOUT CONTRAST TECHNIQUE: Multidetector CT imaging of the head and cervical spine was performed following the standard protocol without intravenous contrast. Multiplanar CT image reconstructions of the cervical spine were also generated. COMPARISON:  12/28/2015 FINDINGS: CT HEAD FINDINGS Brain: Patchy white matter microvascular changes bilaterally as before. No acute intracranial hemorrhage, mass lesion, definite new infarction, midline shift, herniation, hydrocephalus, or extra-axial fluid collection. No focal mass effect or edema. Remote left basal ganglia lacunar-type infarct.  Cisterns are patent. No gross cerebellar abnormality. Remote right cerebellar infarct. Vascular: No hyperdense vessel or unexpected calcification. Skull: Normal. Negative for fracture or focal lesion. Sinuses/Orbits: No acute finding. Other: None. CT CERVICAL SPINE FINDINGS Alignment: Facets are aligned. No subluxation dislocation. Trace anterolisthesis of C4 on C5. Trace retrolisthesis of C6 upon C7. These changes appear secondary to degenerative spondylosis and facet arthropathy. Skull base and vertebrae: No acute cervical spine fracture. Preserved vertebral body heights. No acute compression fracture, wedge-shaped deformity or focal kyphosis. There is a subtle acute nondisplaced fracture of the left transverse process at T1. T1 vertebral body appears intact. This is confirmed on the coronal reconstructions, image 29 series 11. Soft tissues and spinal canal: No prevertebral fluid or swelling. No visible canal hematoma. Disc levels: Moderate degenerative disc disease of the cervical spine, most pronounced at C4-C7. These levels demonstrate marked disc space narrowing, sclerosis and osteophytes. Degenerative changes of the C1-2 articulation. Upper chest: Small left subcutaneous emphysema apical pneumothorax. Left upper chest subcutaneous emphysema noted. Other: None. IMPRESSION: Stable chronic white matter microvascular changes. No acute intracranial abnormality by noncontrast CT. Similar cervical degenerative disc disease/spondylosis without malalignment. No acute cervical spine fracture. Subtle nondisplaced acute fracture of the left transverse process of T1 noted. Left apical pneumothorax and left chest subcutaneous emphysema. These results were called by telephone at the time of interpretation on 01/17/2018 at 3:13 pm to [facility], [facility], who verbally acknowledged these results. Electronically Signed   By: Judie Petit.  Shick M.D.   On: 01/17/2018 15:14   Ct Chest W Contrast  Result Date: 01/17/2018 CLINICAL DATA:  Trauma, fall from roof EXAM: CT CHEST, ABDOMEN, AND PELVIS WITH CONTRAST TECHNIQUE: Multidetector CT imaging of the chest, abdomen and pelvis was performed following the standard protocol during bolus administration of intravenous contrast. CONTRAST:  OMNIPAQUE IOHEXOL 300 MG/ML  SOLN COMPARISON:  01/17/2010 FINDINGS: CT CHEST FINDINGS Cardiovascular: Atherosclerotic changes of the major branch vessels and aorta. Negative for aneurysm or dissection. No mediastinal hemorrhage or hematoma. Normal heart size. Coronary atherosclerosis. No pericardial effusion. Mediastinum/Nodes: No enlarged mediastinal, hilar, or axillary lymph nodes. Thyroid gland, trachea, and esophagus demonstrate no significant findings. Lungs/Pleura: Left anterior pigtail chest tube noted. Small residual left apical and anterior pneumothorax, 10% or less. Background paraseptal emphysema changes noted. Dependent basilar atelectasis. No lobar collapse, significant consolidation, interstitial process, or edema. Musculoskeletal: Numerous acute lateral left rib fractures. Left chest subcutaneous emphysema noted. Left scapula demonstrates a peripheral scapular spine fracture at the base of the acromion. CT ABDOMEN PELVIS FINDINGS Hepatobiliary: No hepatic injury or perihepatic hematoma. Gallbladder is unremarkable Pancreas: Unremarkable. No pancreatic ductal dilatation or surrounding inflammatory changes. Spleen: No splenic injury or perisplenic hematoma. Adrenals/Urinary Tract: Stable adrenal nodularity, suspect adenoma on the left. No renal obstruction, hydronephrosis, acute perinephric inflammatory process, retroperitoneal hemorrhage or hematoma. No hydroureter or ureteral calculus. Bladder unremarkable. Stomach/Bowel: Negative for bowel obstruction, significant dilatation, ileus, or free air. No fluid collection or abscess. Appendix not visualized. Scattered colonic diverticulosis. Vascular/Lymphatic: Aortic atherosclerosis. Minor infrarenal  aortic ectasia, maximal diameter 2.4 cm. Mesenteric and renal vasculature demonstrate atherosclerotic change but appear patent. Iliac atherosclerosis noted. No adenopathy. Reproductive: Prostate normal in size. Seminal vesicles are symmetric. No acute finding CT. Other: No abdominal wall hernia or abnormality. No abdominopelvic ascites. Musculoskeletal: Lower anterior pelvic small soft tissue hematoma in the prevesical space measuring approximately 7.4 x 3.5 cm, image 113 series 3. This is secondary to the acute minimally comminuted displaced midline left superior and inferior rami fractures. No associated midline diastasis. There are additional nondisplaced fractures of the left sacrum, image 100 series 3 and also the posterior left iliac wing, image 91 series 3. SI joints remains symmetric. Hips appear symmetric and intact. IMPRESSION: Small residual left apical and anterior pneumothorax. Left anterior chest tube noted. Numerous acute lateral left rib fractures with left chest subcutaneous emphysema Left scapula spine fracture at the base of the acromion No acute intra-abdominopelvic finding Acute nondisplaced fractures of the left sacrum and posterior left iliac wing Acute minimally comminuted displaced left superior and inferior rami fractures at the symphysis. Anterior pelvic hematoma in the prevesical space. These results were called by telephone at the time of interpretation on 01/17/2018 at 3:15 pm to Wells Guiles, Pac who verbally acknowledged these results. Electronically Signed   By: Judie Petit.  Shick M.D.   On: 01/17/2018 15:33   Ct Cervical Spine Wo Contrast  Result Date: 01/17/2018 CLINICAL DATA:  Trauma, head and neck injury EXAM: CT HEAD WITHOUT CONTRAST CT CERVICAL SPINE WITHOUT CONTRAST TECHNIQUE: Multidetector CT imaging of the head and cervical spine was performed following the standard protocol without intravenous contrast. Multiplanar CT image reconstructions of the cervical spine were also  generated. COMPARISON:  12/28/2015 FINDINGS: CT HEAD FINDINGS Brain: Patchy white matter microvascular changes bilaterally as before. No acute intracranial hemorrhage, mass lesion, definite new infarction, midline shift, herniation, hydrocephalus, or extra-axial fluid collection. No focal mass effect or edema. Remote left basal ganglia lacunar-type infarct. Cisterns are patent. No gross cerebellar abnormality. Remote right cerebellar infarct. Vascular: No hyperdense vessel or unexpected calcification. Skull: Normal. Negative for fracture  or focal lesion. Sinuses/Orbits: No acute finding. Other: None. CT CERVICAL SPINE FINDINGS Alignment: Facets are aligned. No subluxation dislocation. Trace anterolisthesis of C4 on C5. Trace retrolisthesis of C6 upon C7. These changes appear secondary to degenerative spondylosis and facet arthropathy. Skull base and vertebrae: No acute cervical spine fracture. Preserved vertebral body heights. No acute compression fracture, wedge-shaped deformity or focal kyphosis. There is a subtle acute nondisplaced fracture of the left transverse process at T1. T1 vertebral body appears intact. This is confirmed on the coronal reconstructions, image 29 series 11. Soft tissues and spinal canal: No prevertebral fluid or swelling. No visible canal hematoma. Disc levels: Moderate degenerative disc disease of the cervical spine, most pronounced at C4-C7. These levels demonstrate marked disc space narrowing, sclerosis and osteophytes. Degenerative changes of the C1-2 articulation. Upper chest: Small left subcutaneous emphysema apical pneumothorax. Left upper chest subcutaneous emphysema noted. Other: None. IMPRESSION: Stable chronic white matter microvascular changes. No acute intracranial abnormality by noncontrast CT. Similar cervical degenerative disc disease/spondylosis without malalignment. No acute cervical spine fracture. Subtle nondisplaced acute fracture of the left transverse process of T1  noted. Left apical pneumothorax and left chest subcutaneous emphysema. These results were called by telephone at the time of interpretation on 01/17/2018 at 3:13 pm to [facility], [facility], who verbally acknowledged these results. Electronically Signed   By: Judie PetitM.  Shick M.D.   On: 01/17/2018 15:14   Ct Abdomen Pelvis W Contrast  Result Date: 01/17/2018 CLINICAL DATA:  Trauma, fall from roof EXAM: CT CHEST, ABDOMEN, AND PELVIS WITH CONTRAST TECHNIQUE: Multidetector CT imaging of the chest, abdomen and pelvis was performed following the standard protocol during bolus administration of intravenous contrast. CONTRAST:  100mL OMNIPAQUE IOHEXOL 300 MG/ML  SOLN COMPARISON:  01/17/2010 FINDINGS: CT CHEST FINDINGS Cardiovascular: Atherosclerotic changes of the major branch vessels and aorta. Negative for aneurysm or dissection. No mediastinal hemorrhage or hematoma. Normal heart size. Coronary atherosclerosis. No pericardial effusion. Mediastinum/Nodes: No enlarged mediastinal, hilar, or axillary lymph nodes. Thyroid gland, trachea, and esophagus demonstrate no significant findings. Lungs/Pleura: Left anterior pigtail chest tube noted. Small residual left apical and anterior pneumothorax, 10% or less. Background paraseptal emphysema changes noted. Dependent basilar atelectasis. No lobar collapse, significant consolidation, interstitial process, or edema. Musculoskeletal: Numerous acute lateral left rib fractures. Left chest subcutaneous emphysema noted. Left scapula demonstrates a peripheral scapular spine fracture at the base of the acromion. CT ABDOMEN PELVIS FINDINGS Hepatobiliary: No hepatic injury or perihepatic hematoma. Gallbladder is unremarkable Pancreas: Unremarkable. No pancreatic ductal dilatation or surrounding inflammatory changes. Spleen: No splenic injury or perisplenic hematoma. Adrenals/Urinary Tract: Stable adrenal nodularity, suspect adenoma on the left. No renal obstruction, hydronephrosis, acute  perinephric inflammatory process, retroperitoneal hemorrhage or hematoma. No hydroureter or ureteral calculus. Bladder unremarkable. Stomach/Bowel: Negative for bowel obstruction, significant dilatation, ileus, or free air. No fluid collection or abscess. Appendix not visualized. Scattered colonic diverticulosis. Vascular/Lymphatic: Aortic atherosclerosis. Minor infrarenal aortic ectasia, maximal diameter 2.4 cm. Mesenteric and renal vasculature demonstrate atherosclerotic change but appear patent. Iliac atherosclerosis noted. No adenopathy. Reproductive: Prostate normal in size. Seminal vesicles are symmetric. No acute finding CT. Other: No abdominal wall hernia or abnormality. No abdominopelvic ascites. Musculoskeletal: Lower anterior pelvic small soft tissue hematoma in the prevesical space measuring approximately 7.4 x 3.5 cm, image 113 series 3. This is secondary to the acute minimally comminuted displaced midline left superior and inferior rami fractures. No associated midline diastasis. There are additional nondisplaced fractures of the left sacrum, image 100 series 3 and also the  posterior left iliac wing, image 91 series 3. SI joints remains symmetric. Hips appear symmetric and intact. IMPRESSION: Small residual left apical and anterior pneumothorax. Left anterior chest tube noted. Numerous acute lateral left rib fractures with left chest subcutaneous emphysema Left scapula spine fracture at the base of the acromion No acute intra-abdominopelvic finding Acute nondisplaced fractures of the left sacrum and posterior left iliac wing Acute minimally comminuted displaced left superior and inferior rami fractures at the symphysis. Anterior pelvic hematoma in the prevesical space. These results were called by telephone at the time of interpretation on 01/17/2018 at 3:15 pm to Wells GuilesKelly rayburn, Pac who verbally acknowledged these results. Electronically Signed   By: Judie PetitM.  Shick M.D.   On: 01/17/2018 15:33   Dg Pelvis  Portable  Result Date: 01/17/2018 CLINICAL DATA:  Unwitnessed fall from roof, left chest and left shoulder pain EXAM: PORTABLE PELVIS 1-2 VIEWS COMPARISON:  None. FINDINGS: There is slightly displaced fractures of the left pubic ramus post superiorly and inferiorly. There is some disruption of the symphysis pubis on the left. Both hips are normally position with no acute hip fracture. The right ramus appears intact. The SI joints appear corticated. The sacral foramina are unremarkable. IMPRESSION: 1. Comminuted fractures of the left pubic ramus. 2. No other acute fracture. Electronically Signed   By: Dwyane DeePaul  Barry M.D.   On: 01/17/2018 13:34   Dg Pelvis Comp Min 3v  Result Date: 01/17/2018 CLINICAL DATA:  Fall from roof today. Pelvic fractures on current CT. EXAM: JUDET PELVIS - 3+ VIEW COMPARISON:  CT, 01/17/2018 at 1442 hours FINDINGS: Left parasymphyseal fractures are noted. There is a fracture across the left superior pubis and connecting pubic ramus and the inferior pubic ramus at its junction with the pubic symphysis. Fractures are mildly comminuted, displaced a maximum of 9 mm. No other visualized fractures. Fracture of the left sacrum noted on the current CT is not visualized radiographically. The SI joints, symphysis pubis and hip joints are normally spaced and aligned. Residual contrast is noted in the bladder. IMPRESSION: 1. Left parasymphyseal fractures as detailed above, mildly displaced, as noted on the current CT. 2. Left sacral fracture not visualized radiographically. 3. No other fractures.  Joints are normally aligned. Electronically Signed   By: Amie Portlandavid  Ormond M.D.   On: 01/17/2018 16:25   Dg Chest Port 1 View  Result Date: 01/18/2018 CLINICAL DATA:  Left pneumothorax. EXAM: PORTABLE CHEST 1 VIEW COMPARISON:  Radiograph of January 17, 2018. FINDINGS: The heart size and mediastinal contours are within normal limits. Right lung is clear. Left-sided chest tube is unchanged in position. No  definite pneumothorax is noted. Multiple left rib fractures are noted with overlying subcutaneous emphysema. IMPRESSION: Stable position of left-sided chest tube without pneumothorax. Stable subcutaneous emphysema and left rib fractures are noted. Electronically Signed   By: Lupita RaiderJames  Green Jr, M.D.   On: 01/18/2018 08:54   Dg Chest Portable 1 View  Result Date: 01/17/2018 CLINICAL DATA:  Status post chest tube placement. EXAM: PORTABLE CHEST 1 VIEW COMPARISON:  01/17/2018 FINDINGS: Cardiomediastinal silhouette is normal. Interval near complete resolution of left pneumothorax post placement of left chest tube with minimal residual in the lung apex and base. Small left pleural effusion. Significant soft tissue emphysema of the left chest wall. Rib fractures again noted. Osseous structures are without acute abnormality. Soft tissues are grossly normal. IMPRESSION: Minimal residual left pneumothorax, post chest tube placement. Small left pleural effusion. Posttraumatic soft tissue emphysema of the left chest wall  with associated left-sided rib fractures. Electronically Signed   By: Ted Mcalpine M.D.   On: 01/17/2018 16:04   Dg Chest Port 1 View  Result Date: 01/17/2018 CLINICAL DATA:  Unwitnessed fall from roof, left-sided chest pain, left shoulder pain EXAM: PORTABLE CHEST 1 VIEW COMPARISON:  Chest x-ray of 07/10/2016 FINDINGS: There is a left pneumothorax present of approximately 20%. There appear to be a rib fractures involving several lateral ribs possibly the left fourth, fifth, sixth, seventh and eighth ribs although these are not well visualized on the frontal view. There is left chest wall subcutaneous air present. The right lung is clear. Mild cardiomegaly is stable. Old right fifth rib fracture is noted posteriorly. IMPRESSION: 1. Left pneumothorax or possibly 20% with associated left rib fractures probably involving the left fourth through eighth ribs. Left rib detail may be helpful if CT of the  chest is not to be performed. 2.      The right lung is clear. 3.      Stable cardiomegaly. Electronically Signed   By: Dwyane Dee M.D.   On: 01/17/2018 13:33   Dg Humerus Left  Result Date: 01/17/2018 CLINICAL DATA:  Unwitnessed fall from roof, left-sided chest and shoulder pain EXAM: LEFT HUMERUS - 2+ VIEW COMPARISON:  None. FINDINGS: There does appear to be a fracture of the acromion of the left shoulder with slight widening of the [facility] joint. The acromion is also somewhat downward sloping. Left humeral head is intact and normally position and the left humerus appears intact. Subcutaneous left chest wall air is noted. The left pneumothorax again is noted. IMPRESSION: 1. Nondisplaced fracture of the left acromion with slightly widened left AC joint. CT may be helpful to assess further if warranted. 2. No other fracture is seen. 3. Left pneumothorax is noted with left chest wall subcutaneous air. Adjacent left rib fractures are not as well seen. Electronically Signed   By: Dwyane Dee M.D.   On: 01/17/2018 13:36    Anti-infectives: Anti-infectives (From admission, onward)   None      Assessment/Plan: s/p * No surgery found * Wbc up today. With rhonchi as bad as it sounds he could be developing a pneumonia. will check cxr today and consider abx soon if no improvement in wbc Fall from roof Concussion- SLP for cognitive eval L T1 TVP fx- no back pain, PT/OT L rib fractures withHPTX- s/p CT, pain control, IS, pulm toilet, CXR pending LC2 pelvic fracture- per ortho, TDWB; if severe pain with mobilization may require surgical fixation L shoulder fracture- per ortho, sling, NWB LUE Elevated LFT's - tbili normal, AST/ALT mildly elevated, ALP 222, labs pending Hyponatremia- 130, NS ordered HTN- home meds Hx of CAD s/p stent x2  FEN: reg diet, IVF VTE: SCDs, lovenox ID: no abx indicated at this time Follow up: Trauma, ortho Foley: none  Plan: PT/OT/Speech, pain control, pulm toilet,  labs and CXR pending  LOS: 2 days    Chevis Pretty III 01/19/2018

## 2018-01-19 NOTE — Consult Note (Signed)
Physical Medicine and Rehabilitation Consult Reason for Consult: Polytrauma Referring Physician: Dr. Andrey Campanile   HPI: Bryce Moore is a 63 y.o. male with past medical history of OA, COPD, MI who was admitted on 01/17/2018 after a fall from a roof and was found wandering on the ground confused.  History taken from chart review and patient.  He was found to have question, left rib fractures with HTX treated with chest tube, left scapular fracture, left sacral/pelvic fractures and left T1 transverse process fracture.  Head CT reviewed, unremarkable for acute intracranial process.  Dr. Dan Maker recommended TTWB left lower extremity and left upper extremity to be nonweightbearing with sling for support.  Therapy evaluations done revealing mild deficits in attention and problem-solving, difficulty maintaining weightbearing status limitations in mobility and ADLs.   Review of Systems  Cardiovascular: Negative for chest pain.  Musculoskeletal: Positive for back pain, joint pain and myalgias.  All other systems reviewed and are negative.  Past Medical History:  Diagnosis Date  . Arthritis    "all over" (01/17/2018)  . Concussion 01/17/2018   Hattie Perch 01/17/2018  . COPD (chronic obstructive pulmonary disease) (HCC)   . Fall from roof 01/17/2018  . Hypertension   . Multiple fractures 01/17/2018   Left shoulder fx; LC2 pelvic fx ; Mult left rib fxs w/HPTX/notes 01/17/2018  . Myocardial infarction (HCC)    "I've had 2; several years back" (01/17/2018)   Past Surgical History:  Procedure Laterality Date  . ABDOMINAL HERNIA REPAIR    . CHEST TUBE INSERTION Left 01/17/2018  . CORONARY ANGIOPLASTY WITH STENT PLACEMENT     "I have 2 stents" (01/17/2018)  . HERNIA REPAIR     No pertinent family history of trauma Social History:  reports that he has been smoking cigarettes. He has a 20.00 pack-year smoking history. He has never used smokeless tobacco. He reports current alcohol use of about 1.0 standard  drinks of alcohol per week. He reports that he does not use drugs. Allergies: No Known Allergies Medications Prior to Admission  Medication Sig Dispense Refill  . amLODipine (NORVASC) 10 MG tablet Take 10 mg by mouth every morning.    Marland Kitchen atenolol (TENORMIN) 25 MG tablet Take 25 mg by mouth every morning. 0800    . carisoprodol (SOMA) 350 MG tablet Take 350 mg by mouth 2 (two) times daily.    . finasteride (PROSCAR) 5 MG tablet Take 5 mg by mouth daily.    . hydrochlorothiazide (HYDRODIURIL) 25 MG tablet Take 25 mg by mouth every morning.    . sertraline (ZOLOFT) 25 MG tablet Take 25 mg by mouth every morning.    . tamsulosin (FLOMAX) 0.4 MG CAPS capsule Take 0.4 mg by mouth at bedtime.    . traZODone (DESYREL) 50 MG tablet Take 50 mg by mouth at bedtime as needed for sleep.      Home: Home Living Family/patient expects to be discharged to:: Private residence Living Arrangements: Spouse/significant other Available Help at Discharge: Family, Available PRN/intermittently Type of Home: Mobile home Home Access: Stairs to enter [employer] of Steps: 6(steep; less than in the back of the house, but hard to getto) Entrance Stairs-Rails: Can reach both Home Layout: One level Bathroom Shower/Tub: Health visitor: Standard Bathroom Accessibility: No Home Equipment: Environmental consultant - 2 wheels Additional Comments: family stated a ramp could be built in future; steps to get into high bed  Lives With: Spouse  Functional History: Prior Function Level of Independence: Independent(working)  Functional Status:  Mobility: Bed Mobility Overal bed mobility: Needs Assistance General bed mobility comments: in recliner upon arrival Transfers Overall transfer level: Needs assistance Equipment used: (drop arm BSC) Transfers: Lateral/Scoot Transfers, Squat Pivot Transfers Sit to Stand: Max assist, +2 physical assistance, +2 safety/equipment Squat pivot transfers: Max assist, +2 physical  assistance, +2 safety/equipment  Lateral/Scoot Transfers: +2 physical assistance, Max assist(was not efficient) General transfer comment: Pt non compliant at this time with TDWB on LLE; lateral scoots the best way; had to perform squat pivot transfer to get in correct recliner with drop arm. Ambulation/Gait General Gait Details: deferred    ADL: ADL Overall ADL's : Needs assistance/impaired Eating/Feeding: Set up Grooming: Set up, Sitting Upper Body Bathing: Minimal assistance, Sitting Lower Body Bathing: Maximal assistance, Sitting/lateral leans, Bed level Upper Body Dressing : Minimal assistance, Moderate assistance, Sitting, Bed level Lower Body Dressing: Maximal assistance, Cueing for safety, Sitting/lateral leans, Bed level Toilet Transfer: Squat-pivot(with lateral scooting; not able to perform with NWB) Toileting- Clothing Manipulation and Hygiene: Maximal assistance, Total assistance, +2 for physical assistance, +2 for safety/equipment, Sit to/from stand Functional mobility during ADLs: Maximal assistance, +2 for physical assistance, +2 for safety/equipment, Cueing for safety, Cueing for sequencing General ADL Comments: Min to moDA for UB ADL; MaxA LB ADL- ribs and chest severe pain  Cognition: Cognition Overall Cognitive Status: Impaired/Different from baseline Arousal/Alertness: Awake/alert Orientation Level: Oriented X4 Attention: Selective Selective Attention: Impaired Selective Attention Impairment: Verbal complex, Verbal basic Memory: Impaired Memory Impairment: Retrieval deficit Problem Solving: Impaired Problem Solving Impairment: Verbal basic Executive Function: Self Monitoring Self Monitoring: Impaired Self Monitoring Impairment: Verbal basic Behaviors: Impulsive Cognition Arousal/Alertness: Awake/alert Behavior During Therapy: Restless Overall Cognitive Status: Impaired/Different from baseline  Blood pressure (!) 163/93, pulse 87, temperature 98.4 F (36.9  C), temperature source Oral, resp. rate 18, height 5' 10.5" (1.791 m), weight 61.5 kg, SpO2 95 %. Physical Exam  Vitals reviewed. Constitutional: He appears well-developed and well-nourished.  HENT:  Head: Normocephalic and atraumatic.  Eyes: EOM are normal. Right eye exhibits no discharge. Left eye exhibits no discharge.  Neck: Normal range of motion. Neck supple.  Cardiovascular: Normal rate and regular rhythm.  Respiratory: Effort normal.  Chest tube in place Scattered rhonchi  GI: Soft. Bowel sounds are normal.  Musculoskeletal:     Comments: LUE/LLE with tenderness and edema  Neurological: He is alert.  Motor: Right upper extremity: 5/5 proximal distal Left upper extremity: Limited by sling, handgrip 4/5 Right lower extremity: 4+/5 proximal distal Left lower extremity: Limited by pain, hip flexion, knee extension 2/5, ankle dorsiflexion 5/5  Skin:  Scattered ecchymosis  Psychiatric: His mood appears anxious. He expresses impulsivity.    Results for orders placed or performed during the hospital encounter of 01/17/18 (from the past 24 hour(s))  CBC     Status: Abnormal   Collection Time: 01/19/18  5:16 AM  Result Value Ref Range   WBC 26.0 (H) 4.0 - 10.5 K/uL   RBC 3.61 (L) 4.22 - 5.81 MIL/uL   Hemoglobin 11.0 (L) 13.0 - 17.0 g/dL   HCT 16.1 (L) 09.6 - 04.5 %   MCV 92.8 80.0 - 100.0 fL   MCH 30.5 26.0 - 34.0 pg   MCHC 32.8 30.0 - 36.0 g/dL   RDW 40.9 81.1 - 91.4 %   Platelets 328 150 - 400 K/uL   nRBC 0.0 0.0 - 0.2 %  Comprehensive metabolic panel     Status: Abnormal   Collection Time: 01/19/18  5:16 AM  Result Value Ref Range   Sodium 132 (L) 135 - 145 mmol/L   Potassium 4.2 3.5 - 5.1 mmol/L   Chloride 103 98 - 111 mmol/L   CO2 20 (L) 22 - 32 mmol/L   Glucose, Bld 130 (H) 70 - 99 mg/dL   BUN 12 8 - 23 mg/dL   Creatinine, Ser 4.09 0.61 - 1.24 mg/dL   Calcium 8.4 (L) 8.9 - 10.3 mg/dL   Total Protein 5.8 (L) 6.5 - 8.1 g/dL   Albumin 2.9 (L) 3.5 - 5.0 g/dL   AST  35 15 - 41 U/L   ALT 43 0 - 44 U/L   Alkaline Phosphatase 155 (H) 38 - 126 U/L   Total Bilirubin 0.5 0.3 - 1.2 mg/dL   GFR calc non Af Amer >60 >60 mL/min   GFR calc Af Amer >60 >60 mL/min   Anion gap 9 5 - 15   Ct Head Wo Contrast  Result Date: 01/17/2018 CLINICAL DATA:  Trauma, head and neck injury EXAM: CT HEAD WITHOUT CONTRAST CT CERVICAL SPINE WITHOUT CONTRAST TECHNIQUE: Multidetector CT imaging of the head and cervical spine was performed following the standard protocol without intravenous contrast. Multiplanar CT image reconstructions of the cervical spine were also generated. COMPARISON:  12/28/2015 FINDINGS: CT HEAD FINDINGS Brain: Patchy white matter microvascular changes bilaterally as before. No acute intracranial hemorrhage, mass lesion, definite new infarction, midline shift, herniation, hydrocephalus, or extra-axial fluid collection. No focal mass effect or edema. Remote left basal ganglia lacunar-type infarct. Cisterns are patent. No gross cerebellar abnormality. Remote right cerebellar infarct. Vascular: No hyperdense vessel or unexpected calcification. Skull: Normal. Negative for fracture or focal lesion. Sinuses/Orbits: No acute finding. Other: None. CT CERVICAL SPINE FINDINGS Alignment: Facets are aligned. No subluxation dislocation. Trace anterolisthesis of C4 on C5. Trace retrolisthesis of C6 upon C7. These changes appear secondary to degenerative spondylosis and facet arthropathy. Skull base and vertebrae: No acute cervical spine fracture. Preserved vertebral body heights. No acute compression fracture, wedge-shaped deformity or focal kyphosis. There is a subtle acute nondisplaced fracture of the left transverse process at T1. T1 vertebral body appears intact. This is confirmed on the coronal reconstructions, image 29 series 11. Soft tissues and spinal canal: No prevertebral fluid or swelling. No visible canal hematoma. Disc levels: Moderate degenerative disc disease of the cervical  spine, most pronounced at C4-C7. These levels demonstrate marked disc space narrowing, sclerosis and osteophytes. Degenerative changes of the C1-2 articulation. Upper chest: Small left subcutaneous emphysema apical pneumothorax. Left upper chest subcutaneous emphysema noted. Other: None. IMPRESSION: Stable chronic white matter microvascular changes. No acute intracranial abnormality by noncontrast CT. Similar cervical degenerative disc disease/spondylosis without malalignment. No acute cervical spine fracture. Subtle nondisplaced acute fracture of the left transverse process of T1 noted. Left apical pneumothorax and left chest subcutaneous emphysema. These results were called by telephone at the time of interpretation on 01/17/2018 at 3:13 pm to [facility], [facility], who verbally acknowledged these results. Electronically Signed   By: Judie Petit.  Shick M.D.   On: 01/17/2018 15:14   Ct Chest W Contrast  Result Date: 01/17/2018 CLINICAL DATA:  Trauma, fall from roof EXAM: CT CHEST, ABDOMEN, AND PELVIS WITH CONTRAST TECHNIQUE: Multidetector CT imaging of the chest, abdomen and pelvis was performed following the standard protocol during bolus administration of intravenous contrast. CONTRAST:  OMNIPAQUE IOHEXOL 300 MG/ML  SOLN COMPARISON:  01/17/2010 FINDINGS: CT CHEST FINDINGS Cardiovascular: Atherosclerotic changes of the major branch vessels and aorta. Negative for aneurysm or dissection.  No mediastinal hemorrhage or hematoma. Normal heart size. Coronary atherosclerosis. No pericardial effusion. Mediastinum/Nodes: No enlarged mediastinal, hilar, or axillary lymph nodes. Thyroid gland, trachea, and esophagus demonstrate no significant findings. Lungs/Pleura: Left anterior pigtail chest tube noted. Small residual left apical and anterior pneumothorax, 10% or less. Background paraseptal emphysema changes noted. Dependent basilar atelectasis. No lobar collapse, significant consolidation, interstitial process, or edema.  Musculoskeletal: Numerous acute lateral left rib fractures. Left chest subcutaneous emphysema noted. Left scapula demonstrates a peripheral scapular spine fracture at the base of the acromion. CT ABDOMEN PELVIS FINDINGS Hepatobiliary: No hepatic injury or perihepatic hematoma. Gallbladder is unremarkable Pancreas: Unremarkable. No pancreatic ductal dilatation or surrounding inflammatory changes. Spleen: No splenic injury or perisplenic hematoma. Adrenals/Urinary Tract: Stable adrenal nodularity, suspect adenoma on the left. No renal obstruction, hydronephrosis, acute perinephric inflammatory process, retroperitoneal hemorrhage or hematoma. No hydroureter or ureteral calculus. Bladder unremarkable. Stomach/Bowel: Negative for bowel obstruction, significant dilatation, ileus, or free air. No fluid collection or abscess. Appendix not visualized. Scattered colonic diverticulosis. Vascular/Lymphatic: Aortic atherosclerosis. Minor infrarenal aortic ectasia, maximal diameter 2.4 cm. Mesenteric and renal vasculature demonstrate atherosclerotic change but appear patent. Iliac atherosclerosis noted. No adenopathy. Reproductive: Prostate normal in size. Seminal vesicles are symmetric. No acute finding CT. Other: No abdominal wall hernia or abnormality. No abdominopelvic ascites. Musculoskeletal: Lower anterior pelvic small soft tissue hematoma in the prevesical space measuring approximately 7.4 x 3.5 cm, image 113 series 3. This is secondary to the acute minimally comminuted displaced midline left superior and inferior rami fractures. No associated midline diastasis. There are additional nondisplaced fractures of the left sacrum, image 100 series 3 and also the posterior left iliac wing, image 91 series 3. SI joints remains symmetric. Hips appear symmetric and intact. IMPRESSION: Small residual left apical and anterior pneumothorax. Left anterior chest tube noted. Numerous acute lateral left rib fractures with left chest  subcutaneous emphysema Left scapula spine fracture at the base of the acromion No acute intra-abdominopelvic finding Acute nondisplaced fractures of the left sacrum and posterior left iliac wing Acute minimally comminuted displaced left superior and inferior rami fractures at the symphysis. Anterior pelvic hematoma in the prevesical space. These results were called by telephone at the time of interpretation on 01/17/2018 at 3:15 pm to Wells Guiles, Pac who verbally acknowledged these results. Electronically Signed   By: Judie Petit.  Shick M.D.   On: 01/17/2018 15:33   Ct Cervical Spine Wo Contrast  Result Date: 01/17/2018 CLINICAL DATA:  Trauma, head and neck injury EXAM: CT HEAD WITHOUT CONTRAST CT CERVICAL SPINE WITHOUT CONTRAST TECHNIQUE: Multidetector CT imaging of the head and cervical spine was performed following the standard protocol without intravenous contrast. Multiplanar CT image reconstructions of the cervical spine were also generated. COMPARISON:  12/28/2015 FINDINGS: CT HEAD FINDINGS Brain: Patchy white matter microvascular changes bilaterally as before. No acute intracranial hemorrhage, mass lesion, definite new infarction, midline shift, herniation, hydrocephalus, or extra-axial fluid collection. No focal mass effect or edema. Remote left basal ganglia lacunar-type infarct. Cisterns are patent. No gross cerebellar abnormality. Remote right cerebellar infarct. Vascular: No hyperdense vessel or unexpected calcification. Skull: Normal. Negative for fracture or focal lesion. Sinuses/Orbits: No acute finding. Other: None. CT CERVICAL SPINE FINDINGS Alignment: Facets are aligned. No subluxation dislocation. Trace anterolisthesis of C4 on C5. Trace retrolisthesis of C6 upon C7. These changes appear secondary to degenerative spondylosis and facet arthropathy. Skull base and vertebrae: No acute cervical spine fracture. Preserved vertebral body heights. No acute compression fracture, wedge-shaped deformity or focal  kyphosis.  There is a subtle acute nondisplaced fracture of the left transverse process at T1. T1 vertebral body appears intact. This is confirmed on the coronal reconstructions, image 29 series 11. Soft tissues and spinal canal: No prevertebral fluid or swelling. No visible canal hematoma. Disc levels: Moderate degenerative disc disease of the cervical spine, most pronounced at C4-C7. These levels demonstrate marked disc space narrowing, sclerosis and osteophytes. Degenerative changes of the C1-2 articulation. Upper chest: Small left subcutaneous emphysema apical pneumothorax. Left upper chest subcutaneous emphysema noted. Other: None. IMPRESSION: Stable chronic white matter microvascular changes. No acute intracranial abnormality by noncontrast CT. Similar cervical degenerative disc disease/spondylosis without malalignment. No acute cervical spine fracture. Subtle nondisplaced acute fracture of the left transverse process of T1 noted. Left apical pneumothorax and left chest subcutaneous emphysema. These results were called by telephone at the time of interpretation on 01/17/2018 at 3:13 pm to [facility], [facility], who verbally acknowledged these results. Electronically Signed   By: Judie Petit.  Shick M.D.   On: 01/17/2018 15:14   Ct Abdomen Pelvis W Contrast  Result Date: 01/17/2018 CLINICAL DATA:  Trauma, fall from roof EXAM: CT CHEST, ABDOMEN, AND PELVIS WITH CONTRAST TECHNIQUE: Multidetector CT imaging of the chest, abdomen and pelvis was performed following the standard protocol during bolus administration of intravenous contrast. CONTRAST:  OMNIPAQUE IOHEXOL 300 MG/ML  SOLN COMPARISON:  01/17/2010 FINDINGS: CT CHEST FINDINGS Cardiovascular: Atherosclerotic changes of the major branch vessels and aorta. Negative for aneurysm or dissection. No mediastinal hemorrhage or hematoma. Normal heart size. Coronary atherosclerosis. No pericardial effusion. Mediastinum/Nodes: No enlarged mediastinal, hilar, or axillary lymph  nodes. Thyroid gland, trachea, and esophagus demonstrate no significant findings. Lungs/Pleura: Left anterior pigtail chest tube noted. Small residual left apical and anterior pneumothorax, 10% or less. Background paraseptal emphysema changes noted. Dependent basilar atelectasis. No lobar collapse, significant consolidation, interstitial process, or edema. Musculoskeletal: Numerous acute lateral left rib fractures. Left chest subcutaneous emphysema noted. Left scapula demonstrates a peripheral scapular spine fracture at the base of the acromion. CT ABDOMEN PELVIS FINDINGS Hepatobiliary: No hepatic injury or perihepatic hematoma. Gallbladder is unremarkable Pancreas: Unremarkable. No pancreatic ductal dilatation or surrounding inflammatory changes. Spleen: No splenic injury or perisplenic hematoma. Adrenals/Urinary Tract: Stable adrenal nodularity, suspect adenoma on the left. No renal obstruction, hydronephrosis, acute perinephric inflammatory process, retroperitoneal hemorrhage or hematoma. No hydroureter or ureteral calculus. Bladder unremarkable. Stomach/Bowel: Negative for bowel obstruction, significant dilatation, ileus, or free air. No fluid collection or abscess. Appendix not visualized. Scattered colonic diverticulosis. Vascular/Lymphatic: Aortic atherosclerosis. Minor infrarenal aortic ectasia, maximal diameter 2.4 cm. Mesenteric and renal vasculature demonstrate atherosclerotic change but appear patent. Iliac atherosclerosis noted. No adenopathy. Reproductive: Prostate normal in size. Seminal vesicles are symmetric. No acute finding CT. Other: No abdominal wall hernia or abnormality. No abdominopelvic ascites. Musculoskeletal: Lower anterior pelvic small soft tissue hematoma in the prevesical space measuring approximately 7.4 x 3.5 cm, image 113 series 3. This is secondary to the acute minimally comminuted displaced midline left superior and inferior rami fractures. No associated midline diastasis. There  are additional nondisplaced fractures of the left sacrum, image 100 series 3 and also the posterior left iliac wing, image 91 series 3. SI joints remains symmetric. Hips appear symmetric and intact. IMPRESSION: Small residual left apical and anterior pneumothorax. Left anterior chest tube noted. Numerous acute lateral left rib fractures with left chest subcutaneous emphysema Left scapula spine fracture at the base of the acromion No acute intra-abdominopelvic finding Acute nondisplaced fractures of the left sacrum and posterior  left iliac wing Acute minimally comminuted displaced left superior and inferior rami fractures at the symphysis. Anterior pelvic hematoma in the prevesical space. These results were called by telephone at the time of interpretation on 01/17/2018 at 3:15 pm to Wells GuilesKelly rayburn, Pac who verbally acknowledged these results. Electronically Signed   By: Judie PetitM.  Shick M.D.   On: 01/17/2018 15:33   Dg Pelvis Portable  Result Date: 01/17/2018 CLINICAL DATA:  Unwitnessed fall from roof, left chest and left shoulder pain EXAM: PORTABLE PELVIS 1-2 VIEWS COMPARISON:  None. FINDINGS: There is slightly displaced fractures of the left pubic ramus post superiorly and inferiorly. There is some disruption of the symphysis pubis on the left. Both hips are normally position with no acute hip fracture. The right ramus appears intact. The SI joints appear corticated. The sacral foramina are unremarkable. IMPRESSION: 1. Comminuted fractures of the left pubic ramus. 2. No other acute fracture. Electronically Signed   By: Dwyane DeePaul  Barry M.D.   On: 01/17/2018 13:34   Dg Pelvis Comp Min 3v  Result Date: 01/17/2018 CLINICAL DATA:  Fall from roof today. Pelvic fractures on current CT. EXAM: JUDET PELVIS - 3+ VIEW COMPARISON:  CT, 01/17/2018 at 1442 hours FINDINGS: Left parasymphyseal fractures are noted. There is a fracture across the left superior pubis and connecting pubic ramus and the inferior pubic ramus at its junction  with the pubic symphysis. Fractures are mildly comminuted, displaced a maximum of 9 mm. No other visualized fractures. Fracture of the left sacrum noted on the current CT is not visualized radiographically. The SI joints, symphysis pubis and hip joints are normally spaced and aligned. Residual contrast is noted in the bladder. IMPRESSION: 1. Left parasymphyseal fractures as detailed above, mildly displaced, as noted on the current CT. 2. Left sacral fracture not visualized radiographically. 3. No other fractures.  Joints are normally aligned. Electronically Signed   By: Amie Portlandavid  Ormond M.D.   On: 01/17/2018 16:25   Dg Chest Port 1 View  Result Date: 01/19/2018 CLINICAL DATA:  63 year old male with pneumothorax EXAM: PORTABLE CHEST 1 VIEW COMPARISON:  Chest x-ray yesterday, 01/18/2018 FINDINGS: Pigtail thoracostomy tube overlies the left chest. No definite residual left-sided pneumothorax. Persistent subcutaneous emphysema along the left chest wall extending into the soft tissues of the left neck. Ectatic ascending thoracic aorta. The heart contour is normal in size. Chronic bronchitic changes. No focal airspace consolidation or large pleural effusion. Remote healed right fifth rib fracture. No acute osseous abnormality. IMPRESSION: 1. Stable left pigtail thoracostomy tube without definitive residual pneumothorax. 2. Persistent but improving subcutaneous emphysema. Electronically Signed   By: Malachy MoanHeath  McCullough M.D.   On: 01/19/2018 08:45   Dg Chest Port 1 View  Result Date: 01/18/2018 CLINICAL DATA:  Left pneumothorax. EXAM: PORTABLE CHEST 1 VIEW COMPARISON:  Radiograph of January 17, 2018. FINDINGS: The heart size and mediastinal contours are within normal limits. Right lung is clear. Left-sided chest tube is unchanged in position. No definite pneumothorax is noted. Multiple left rib fractures are noted with overlying subcutaneous emphysema. IMPRESSION: Stable position of left-sided chest tube without  pneumothorax. Stable subcutaneous emphysema and left rib fractures are noted. Electronically Signed   By: Lupita RaiderJames  Green Jr, M.D.   On: 01/18/2018 08:54   Dg Chest Portable 1 View  Result Date: 01/17/2018 CLINICAL DATA:  Status post chest tube placement. EXAM: PORTABLE CHEST 1 VIEW COMPARISON:  01/17/2018 FINDINGS: Cardiomediastinal silhouette is normal. Interval near complete resolution of left pneumothorax post placement of left chest tube with  minimal residual in the lung apex and base. Small left pleural effusion. Significant soft tissue emphysema of the left chest wall. Rib fractures again noted. Osseous structures are without acute abnormality. Soft tissues are grossly normal. IMPRESSION: Minimal residual left pneumothorax, post chest tube placement. Small left pleural effusion. Posttraumatic soft tissue emphysema of the left chest wall with associated left-sided rib fractures. Electronically Signed   By: Ted Mcalpineobrinka  Dimitrova M.D.   On: 01/17/2018 16:04   Dg Chest Port 1 View  Result Date: 01/17/2018 CLINICAL DATA:  Unwitnessed fall from roof, left-sided chest pain, left shoulder pain EXAM: PORTABLE CHEST 1 VIEW COMPARISON:  Chest x-ray of 07/10/2016 FINDINGS: There is a left pneumothorax present of approximately 20%. There appear to be a rib fractures involving several lateral ribs possibly the left fourth, fifth, sixth, seventh and eighth ribs although these are not well visualized on the frontal view. There is left chest wall subcutaneous air present. The right lung is clear. Mild cardiomegaly is stable. Old right fifth rib fracture is noted posteriorly. IMPRESSION: 1. Left pneumothorax or possibly 20% with associated left rib fractures probably involving the left fourth through eighth ribs. Left rib detail may be helpful if CT of the chest is not to be performed. 2.      The right lung is clear. 3.      Stable cardiomegaly. Electronically Signed   By: Dwyane DeePaul  Barry M.D.   On: 01/17/2018 13:33   Dg  Humerus Left  Result Date: 01/17/2018 CLINICAL DATA:  Unwitnessed fall from roof, left-sided chest and shoulder pain EXAM: LEFT HUMERUS - 2+ VIEW COMPARISON:  None. FINDINGS: There does appear to be a fracture of the acromion of the left shoulder with slight widening of the [facility] joint. The acromion is also somewhat downward sloping. Left humeral head is intact and normally position and the left humerus appears intact. Subcutaneous left chest wall air is noted. The left pneumothorax again is noted. IMPRESSION: 1. Nondisplaced fracture of the left acromion with slightly widened left AC joint. CT may be helpful to assess further if warranted. 2. No other fracture is seen. 3. Left pneumothorax is noted with left chest wall subcutaneous air. Adjacent left rib fractures are not as well seen. Electronically Signed   By: Dwyane DeePaul  Barry M.D.   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  Records reviewed and summated above.  1. Does the need for close, 24 hr/day medical supervision in concert with the patient's rehab needs make it unreasonable for this patient to be served in a less intensive setting? Yes 2. Co-Morbidities requiring supervision/potential complications: OA (ensure pain does not limit therapies), COPD (monitor respiratory rate and O2 sats with increased physical exertion), CAD (continue meds), HTN (monitor and provide prns in accordance with increased physical exertion and pain), pain (Biofeedback training with therapies to help reduce reliance on opiate pain medications, monitor pain control during therapies, and sedation at rest and titrate to maximum efficacy to ensure participation and gains in therapies), hyponatremia (continue to monitor, treat if necessary), leukocytosis (repeat labs, cont to monitor for signs and symptoms of infection, further workup if indicated), pneumothorax (removed chest tube when  appropriate) 3. Due to safety, skin/wound care, disease management, medication administration, pain management and patient education, does the patient require 24 hr/day rehab nursing? Yes 4. Does the patient require coordinated care of a physician, rehab nurse, PT (1-2 hrs/day, 5 days/week), OT (1-2 hrs/day,  5 days/week) and SLP (1-2 hrs/day, 5 days/week) to address physical and functional deficits in the context of the above medical diagnosis(es)? Yes Addressing deficits in the following areas: balance, endurance, locomotion, strength, transferring, bathing, dressing, toileting, cognition, language and psychosocial support 5. Can the patient actively participate in an intensive therapy program of at least 3 hrs of therapy per day at least 5 days per week? Potentially 6. The potential for patient to make measurable gains while on inpatient rehab is excellent 7. Anticipated functional outcomes upon discharge from inpatient rehab are Mod I wheelchair level  with PT, min assist and mod assist with OT, supervision with SLP. 8. Estimated rehab length of stay to reach the above functional goals is: 17-20 days. 9. Anticipated D/C setting: TBD 10. Anticipated post D/C treatments: HH therapy and Home excercise program 11. Overall Rehab/Functional Prognosis: excellent  RECOMMENDATIONS: This patient's condition is appropriate for continued rehabilitative care in the following setting: Recommend CIR if patient agreeable when medically stable, however patient refusing prolonged hospital stay at present, stating that he is going home. Patient has agreed to participate in recommended program. Potentially Note that insurance prior authorization may be required for reimbursement for recommended care.  Comment: Rehab Admissions Coordinator to follow up.   Maryla Morrow, MD, ABPMR 01/19/2018

## 2018-01-19 NOTE — ED Provider Notes (Signed)
[facility] [address]  SURGICAL Provider Note   CSN: [id number] Arrival date & time: 01/17/18  1303     History   Chief Complaint No chief complaint on file.   HPI VADHIR MCNAY is a 63 y.o. male.   Trauma Mechanism of injury: fall Injury location: torso, shoulder/arm and pelvis Injury location detail: L shoulder and L wrist, L chest and pelvis Incident location: at work Arrived directly from scene: yes   Fall:      Fall occurred: from a roof      Height of fall: 20 feet      Point of impact: unknown  Protective equipment:       No gloves or helmet.       None      Suspicion of alcohol use: no      Suspicion of drug use: no  EMS/PTA data:      Bystander interventions: bystanders noticed he was confused and called 911.      Blood loss: minimal      Responsiveness: alert      Oriented to: person and situation      Loss of consciousness: unknown, probably.      Airway interventions: none  Current symptoms:      Associated symptoms:            Reports back pain. Loss of consciousness: unknown, probably.    Past Medical History:  Diagnosis Date  . Arthritis    "all over" (01/17/2018)  . Concussion 01/17/2018   Hattie Perch 01/17/2018  . COPD (chronic obstructive pulmonary disease) (HCC)   . Fall from roof 01/17/2018  . Hypertension   . Multiple fractures 01/17/2018   Left shoulder fx; LC2 pelvic fx ; Mult left rib fxs w/HPTX/notes 01/17/2018  . Myocardial infarction (HCC)    "I've had 2; several years back" (01/17/2018)    Patient Active Problem List   Diagnosis Date Noted  . Fall from roof 01/17/2018    Past Surgical History:  Procedure Laterality Date  . ABDOMINAL HERNIA REPAIR    . CHEST TUBE INSERTION Left 01/17/2018  . CORONARY ANGIOPLASTY WITH STENT PLACEMENT     "I have 2 stents" (01/17/2018)  . HERNIA REPAIR          Home Medications    Prior to Admission medications   Medication Sig Start Date End Date Taking? Authorizing  Provider  amLODipine (NORVASC) 10 MG tablet Take 10 mg by mouth every morning.   Yes [provider]  atenolol (TENORMIN) 25 MG tablet Take 25 mg by mouth every morning. 0800   Yes [provider]  carisoprodol (SOMA) 350 MG tablet Take 350 mg by mouth 2 (two) times daily. 05/01/17  Yes [provider]  finasteride (PROSCAR) 5 MG tablet Take 5 mg by mouth daily.   Yes [provider]  hydrochlorothiazide (HYDRODIURIL) 25 MG tablet Take 25 mg by mouth every morning. 05/14/17  Yes [provider]  sertraline (ZOLOFT) 25 MG tablet Take 25 mg by mouth every morning. 04/11/17  Yes [provider]  tamsulosin (FLOMAX) 0.4 MG CAPS capsule Take 0.4 mg by mouth at bedtime.   Yes [provider]  traZODone (DESYREL) 50 MG tablet Take 50 mg by mouth at bedtime as needed for sleep. 04/03/17  Yes [provider]    Family History History reviewed. No pertinent family history.  Social History Social History   Tobacco Use  . Smoking status: Current Every Day  Smoker    Packs/day: 0.50    Years: 40.00    Pack years: 20.00    Types: Cigarettes  . Smokeless tobacco: Never Used  Substance Use Topics  . Alcohol use: Yes    Alcohol/week: 1.0 standard drinks    Types: 1 Cans of beer per week  . Drug use: Never     Allergies   Patient has no known allergies.   Review of Systems Review of Systems  Musculoskeletal: Positive for back pain and myalgias (left pelvis, left shoulder and left ribs).  Neurological: Loss of consciousness: unknown, probably.  All other systems reviewed and are negative.    Physical Exam Updated Vital Signs BP 129/80 (BP Location: Right Arm)   Pulse 80   Temp 98.4 F (36.9 C) (Oral)   Resp 20   Ht 5' 10.5" (1.791 m)   Wt 61.5 kg   SpO2 97%   BMI 19.18 kg/m   Physical Exam Vitals signs and nursing note reviewed.  Constitutional:      General: He is in acute distress (2/2 pain and sob).  HENT:       Head: Normocephalic.     Right Ear: Tympanic membrane normal.     Left Ear: Tympanic membrane normal.     Mouth/Throat:     Mouth: Mucous membranes are moist.  Eyes:     Pupils: Pupils are equal, round, and reactive to light.  Pulmonary:     Effort: Tachypnea present.     Breath sounds: Decreased air movement (right) present. Rales (diminished and rales on left) present.     Comments: Crepitus all along left upper ribcage Chest:     Chest wall: Tenderness (left) present.  Musculoskeletal:        General: Tenderness (over pelvis and left proximal humerus) present.  Skin:    General: Skin is warm and dry.  Neurological:     Mental Status: He is alert.      ED Treatments / Results  Labs (all labs ordered are listed, but only abnormal results are displayed) Labs Reviewed  COMPREHENSIVE METABOLIC PANEL - Abnormal; Notable for the following components:      Result Value   Sodium 130 (*)    Potassium 3.3 (*)    Chloride 95 (*)    Glucose, Bld 111 (*)    Calcium 8.8 (*)    Albumin 3.3 (*)    AST 114 (*)    ALT 71 (*)    Alkaline Phosphatase 222 (*)    All other components within normal limits  CBC - Abnormal; Notable for the following components:   WBC 29.0 (*)    All other components within normal limits  URINALYSIS, ROUTINE W REFLEX MICROSCOPIC - Abnormal; Notable for the following components:   Color, Urine STRAW (*)    Hgb urine dipstick SMALL (*)    All other components within normal limits  CBC - Abnormal; Notable for the following components:   WBC 20.5 (*)    RBC 4.14 (*)    HCT 37.8 (*)    All other components within normal limits  COMPREHENSIVE METABOLIC PANEL - Abnormal; Notable for the following components:   Sodium 132 (*)    Glucose, Bld 123 (*)    BUN 5 (*)    Calcium 8.5 (*)    Albumin 3.2 (*)    AST 76 (*)    ALT 60 (*)    Alkaline Phosphatase 198 (*)    All other components within normal  limits  I-STAT CHEM 8, ED - Abnormal; Notable for the  following components:   Sodium 131 (*)    Potassium 3.3 (*)    Chloride 95 (*)    Glucose, Bld 109 (*)    Calcium, Ion 1.06 (*)    All other components within normal limits  I-STAT CG4 LACTIC ACID, ED - Abnormal; Notable for the following components:   Lactic Acid, Venous 2.02 (*)    All other components within normal limits  MRSA PCR SCREENING  CDS SEROLOGY  ETHANOL  PROTIME-INR  CBC  COMPREHENSIVE METABOLIC PANEL  TYPE AND SCREEN  ABO/RH    EKG None  Radiology Ct Head Wo Contrast  Result Date: 01/17/2018 CLINICAL DATA:  Trauma, head and neck injury EXAM: CT HEAD WITHOUT CONTRAST CT CERVICAL SPINE WITHOUT CONTRAST TECHNIQUE: Multidetector CT imaging of the head and cervical spine was performed following the standard protocol without intravenous contrast. Multiplanar CT image reconstructions of the cervical spine were also generated. COMPARISON:  12/28/2015 FINDINGS: CT HEAD FINDINGS Brain: Patchy white matter microvascular changes bilaterally as before. No acute intracranial hemorrhage, mass lesion, definite new infarction, midline shift, herniation, hydrocephalus, or extra-axial fluid collection. No focal mass effect or edema. Remote left basal ganglia lacunar-type infarct. Cisterns are patent. No gross cerebellar abnormality. Remote right cerebellar infarct. Vascular: No hyperdense vessel or unexpected calcification. Skull: Normal. Negative for fracture or focal lesion. Sinuses/Orbits: No acute finding. Other: None. CT CERVICAL SPINE FINDINGS Alignment: Facets are aligned. No subluxation dislocation. Trace anterolisthesis of C4 on C5. Trace retrolisthesis of C6 upon C7. These changes appear secondary to degenerative spondylosis and facet arthropathy. Skull base and vertebrae: No acute cervical spine fracture. Preserved vertebral body heights. No acute compression fracture, wedge-shaped deformity or focal kyphosis. There is a subtle acute nondisplaced fracture of the left transverse  process at T1. T1 vertebral body appears intact. This is confirmed on the coronal reconstructions, image 29 series 11. Soft tissues and spinal canal: No prevertebral fluid or swelling. No visible canal hematoma. Disc levels: Moderate degenerative disc disease of the cervical spine, most pronounced at C4-C7. These levels demonstrate marked disc space narrowing, sclerosis and osteophytes. Degenerative changes of the C1-2 articulation. Upper chest: Small left subcutaneous emphysema apical pneumothorax. Left upper chest subcutaneous emphysema noted. Other: None. IMPRESSION: Stable chronic white matter microvascular changes. No acute intracranial abnormality by noncontrast CT. Similar cervical degenerative disc disease/spondylosis without malalignment. No acute cervical spine fracture. Subtle nondisplaced acute fracture of the left transverse process of T1 noted. Left apical pneumothorax and left chest subcutaneous emphysema. These results were called by telephone at the time of interpretation on 01/17/2018 at 3:13 pm to [facility], [facility], who verbally acknowledged these results. Electronically Signed   By: Judie Petit.  Shick M.D.   On: 01/17/2018 15:14   Ct Chest W Contrast  Result Date: 01/17/2018 CLINICAL DATA:  Trauma, fall from roof EXAM: CT CHEST, ABDOMEN, AND PELVIS WITH CONTRAST TECHNIQUE: Multidetector CT imaging of the chest, abdomen and pelvis was performed following the standard protocol during bolus administration of intravenous contrast. CONTRAST:  OMNIPAQUE IOHEXOL 300 MG/ML  SOLN COMPARISON:  01/17/2010 FINDINGS: CT CHEST FINDINGS Cardiovascular: Atherosclerotic changes of the major branch vessels and aorta. Negative for aneurysm or dissection. No mediastinal hemorrhage or hematoma. Normal heart size. Coronary atherosclerosis. No pericardial effusion. Mediastinum/Nodes: No enlarged mediastinal, hilar, or axillary lymph nodes. Thyroid gland, trachea, and esophagus demonstrate no significant findings.  Lungs/Pleura: Left anterior pigtail chest tube noted. Small residual left apical and anterior  pneumothorax, 10% or less. Background paraseptal emphysema changes noted. Dependent basilar atelectasis. No lobar collapse, significant consolidation, interstitial process, or edema. Musculoskeletal: Numerous acute lateral left rib fractures. Left chest subcutaneous emphysema noted. Left scapula demonstrates a peripheral scapular spine fracture at the base of the acromion. CT ABDOMEN PELVIS FINDINGS Hepatobiliary: No hepatic injury or perihepatic hematoma. Gallbladder is unremarkable Pancreas: Unremarkable. No pancreatic ductal dilatation or surrounding inflammatory changes. Spleen: No splenic injury or perisplenic hematoma. Adrenals/Urinary Tract: Stable adrenal nodularity, suspect adenoma on the left. No renal obstruction, hydronephrosis, acute perinephric inflammatory process, retroperitoneal hemorrhage or hematoma. No hydroureter or ureteral calculus. Bladder unremarkable. Stomach/Bowel: Negative for bowel obstruction, significant dilatation, ileus, or free air. No fluid collection or abscess. Appendix not visualized. Scattered colonic diverticulosis. Vascular/Lymphatic: Aortic atherosclerosis. Minor infrarenal aortic ectasia, maximal diameter 2.4 cm. Mesenteric and renal vasculature demonstrate atherosclerotic change but appear patent. Iliac atherosclerosis noted. No adenopathy. Reproductive: Prostate normal in size. Seminal vesicles are symmetric. No acute finding CT. Other: No abdominal wall hernia or abnormality. No abdominopelvic ascites. Musculoskeletal: Lower anterior pelvic small soft tissue hematoma in the prevesical space measuring approximately 7.4 x 3.5 cm, image 113 series 3. This is secondary to the acute minimally comminuted displaced midline left superior and inferior rami fractures. No associated midline diastasis. There are additional nondisplaced fractures of the left sacrum, image 100 series 3 and  also the posterior left iliac wing, image 91 series 3. SI joints remains symmetric. Hips appear symmetric and intact. IMPRESSION: Small residual left apical and anterior pneumothorax. Left anterior chest tube noted. Numerous acute lateral left rib fractures with left chest subcutaneous emphysema Left scapula spine fracture at the base of the acromion No acute intra-abdominopelvic finding Acute nondisplaced fractures of the left sacrum and posterior left iliac wing Acute minimally comminuted displaced left superior and inferior rami fractures at the symphysis. Anterior pelvic hematoma in the prevesical space. These results were called by telephone at the time of interpretation on 01/17/2018 at 3:15 pm to Wells Guiles, Pac who verbally acknowledged these results. Electronically Signed   By: Judie Petit.  Shick M.D.   On: 01/17/2018 15:33   Ct Cervical Spine Wo Contrast  Result Date: 01/17/2018 CLINICAL DATA:  Trauma, head and neck injury EXAM: CT HEAD WITHOUT CONTRAST CT CERVICAL SPINE WITHOUT CONTRAST TECHNIQUE: Multidetector CT imaging of the head and cervical spine was performed following the standard protocol without intravenous contrast. Multiplanar CT image reconstructions of the cervical spine were also generated. COMPARISON:  12/28/2015 FINDINGS: CT HEAD FINDINGS Brain: Patchy white matter microvascular changes bilaterally as before. No acute intracranial hemorrhage, mass lesion, definite new infarction, midline shift, herniation, hydrocephalus, or extra-axial fluid collection. No focal mass effect or edema. Remote left basal ganglia lacunar-type infarct. Cisterns are patent. No gross cerebellar abnormality. Remote right cerebellar infarct. Vascular: No hyperdense vessel or unexpected calcification. Skull: Normal. Negative for fracture or focal lesion. Sinuses/Orbits: No acute finding. Other: None. CT CERVICAL SPINE FINDINGS Alignment: Facets are aligned. No subluxation dislocation. Trace anterolisthesis of C4 on C5.  Trace retrolisthesis of C6 upon C7. These changes appear secondary to degenerative spondylosis and facet arthropathy. Skull base and vertebrae: No acute cervical spine fracture. Preserved vertebral body heights. No acute compression fracture, wedge-shaped deformity or focal kyphosis. There is a subtle acute nondisplaced fracture of the left transverse process at T1. T1 vertebral body appears intact. This is confirmed on the coronal reconstructions, image 29 series 11. Soft tissues and spinal canal: No prevertebral fluid or swelling. No visible canal hematoma. Disc  levels: Moderate degenerative disc disease of the cervical spine, most pronounced at C4-C7. These levels demonstrate marked disc space narrowing, sclerosis and osteophytes. Degenerative changes of the C1-2 articulation. Upper chest: Small left subcutaneous emphysema apical pneumothorax. Left upper chest subcutaneous emphysema noted. Other: None. IMPRESSION: Stable chronic white matter microvascular changes. No acute intracranial abnormality by noncontrast CT. Similar cervical degenerative disc disease/spondylosis without malalignment. No acute cervical spine fracture. Subtle nondisplaced acute fracture of the left transverse process of T1 noted. Left apical pneumothorax and left chest subcutaneous emphysema. These results were called by telephone at the time of interpretation on 01/17/2018 at 3:13 pm to [facility], [facility], who verbally acknowledged these results. Electronically Signed   By: Judie Petit.  Shick M.D.   On: 01/17/2018 15:14   Ct Abdomen Pelvis W Contrast  Result Date: 01/17/2018 CLINICAL DATA:  Trauma, fall from roof EXAM: CT CHEST, ABDOMEN, AND PELVIS WITH CONTRAST TECHNIQUE: Multidetector CT imaging of the chest, abdomen and pelvis was performed following the standard protocol during bolus administration of intravenous contrast. CONTRAST:  OMNIPAQUE IOHEXOL 300 MG/ML  SOLN COMPARISON:  01/17/2010 FINDINGS: CT CHEST FINDINGS Cardiovascular:  Atherosclerotic changes of the major branch vessels and aorta. Negative for aneurysm or dissection. No mediastinal hemorrhage or hematoma. Normal heart size. Coronary atherosclerosis. No pericardial effusion. Mediastinum/Nodes: No enlarged mediastinal, hilar, or axillary lymph nodes. Thyroid gland, trachea, and esophagus demonstrate no significant findings. Lungs/Pleura: Left anterior pigtail chest tube noted. Small residual left apical and anterior pneumothorax, 10% or less. Background paraseptal emphysema changes noted. Dependent basilar atelectasis. No lobar collapse, significant consolidation, interstitial process, or edema. Musculoskeletal: Numerous acute lateral left rib fractures. Left chest subcutaneous emphysema noted. Left scapula demonstrates a peripheral scapular spine fracture at the base of the acromion. CT ABDOMEN PELVIS FINDINGS Hepatobiliary: No hepatic injury or perihepatic hematoma. Gallbladder is unremarkable Pancreas: Unremarkable. No pancreatic ductal dilatation or surrounding inflammatory changes. Spleen: No splenic injury or perisplenic hematoma. Adrenals/Urinary Tract: Stable adrenal nodularity, suspect adenoma on the left. No renal obstruction, hydronephrosis, acute perinephric inflammatory process, retroperitoneal hemorrhage or hematoma. No hydroureter or ureteral calculus. Bladder unremarkable. Stomach/Bowel: Negative for bowel obstruction, significant dilatation, ileus, or free air. No fluid collection or abscess. Appendix not visualized. Scattered colonic diverticulosis. Vascular/Lymphatic: Aortic atherosclerosis. Minor infrarenal aortic ectasia, maximal diameter 2.4 cm. Mesenteric and renal vasculature demonstrate atherosclerotic change but appear patent. Iliac atherosclerosis noted. No adenopathy. Reproductive: Prostate normal in size. Seminal vesicles are symmetric. No acute finding CT. Other: No abdominal wall hernia or abnormality. No abdominopelvic ascites. Musculoskeletal: Lower  anterior pelvic small soft tissue hematoma in the prevesical space measuring approximately 7.4 x 3.5 cm, image 113 series 3. This is secondary to the acute minimally comminuted displaced midline left superior and inferior rami fractures. No associated midline diastasis. There are additional nondisplaced fractures of the left sacrum, image 100 series 3 and also the posterior left iliac wing, image 91 series 3. SI joints remains symmetric. Hips appear symmetric and intact. IMPRESSION: Small residual left apical and anterior pneumothorax. Left anterior chest tube noted. Numerous acute lateral left rib fractures with left chest subcutaneous emphysema Left scapula spine fracture at the base of the acromion No acute intra-abdominopelvic finding Acute nondisplaced fractures of the left sacrum and posterior left iliac wing Acute minimally comminuted displaced left superior and inferior rami fractures at the symphysis. Anterior pelvic hematoma in the prevesical space. These results were called by telephone at the time of interpretation on 01/17/2018 at 3:15 pm to Wells Guiles, Pac who verbally  acknowledged these results. Electronically Signed   By: Judie PetitM.  Shick M.D.   On: 01/17/2018 15:33   Dg Pelvis Portable  Result Date: 01/17/2018 CLINICAL DATA:  Unwitnessed fall from roof, left chest and left shoulder pain EXAM: PORTABLE PELVIS 1-2 VIEWS COMPARISON:  None. FINDINGS: There is slightly displaced fractures of the left pubic ramus post superiorly and inferiorly. There is some disruption of the symphysis pubis on the left. Both hips are normally position with no acute hip fracture. The right ramus appears intact. The SI joints appear corticated. The sacral foramina are unremarkable. IMPRESSION: 1. Comminuted fractures of the left pubic ramus. 2. No other acute fracture. Electronically Signed   By: Dwyane DeePaul  Barry M.D.   On: 01/17/2018 13:34   Dg Pelvis Comp Min 3v  Result Date: 01/17/2018 CLINICAL DATA:  Fall from roof today.  Pelvic fractures on current CT. EXAM: JUDET PELVIS - 3+ VIEW COMPARISON:  CT, 01/17/2018 at 1442 hours FINDINGS: Left parasymphyseal fractures are noted. There is a fracture across the left superior pubis and connecting pubic ramus and the inferior pubic ramus at its junction with the pubic symphysis. Fractures are mildly comminuted, displaced a maximum of 9 mm. No other visualized fractures. Fracture of the left sacrum noted on the current CT is not visualized radiographically. The SI joints, symphysis pubis and hip joints are normally spaced and aligned. Residual contrast is noted in the bladder. IMPRESSION: 1. Left parasymphyseal fractures as detailed above, mildly displaced, as noted on the current CT. 2. Left sacral fracture not visualized radiographically. 3. No other fractures.  Joints are normally aligned. Electronically Signed   By: Amie Portlandavid  Ormond M.D.   On: 01/17/2018 16:25   Dg Chest Port 1 View  Result Date: 01/18/2018 CLINICAL DATA:  Left pneumothorax. EXAM: PORTABLE CHEST 1 VIEW COMPARISON:  Radiograph of January 17, 2018. FINDINGS: The heart size and mediastinal contours are within normal limits. Right lung is clear. Left-sided chest tube is unchanged in position. No definite pneumothorax is noted. Multiple left rib fractures are noted with overlying subcutaneous emphysema. IMPRESSION: Stable position of left-sided chest tube without pneumothorax. Stable subcutaneous emphysema and left rib fractures are noted. Electronically Signed   By: Lupita RaiderJames  Green Jr, M.D.   On: 01/18/2018 08:54   Dg Chest Portable 1 View  Result Date: 01/17/2018 CLINICAL DATA:  Status post chest tube placement. EXAM: PORTABLE CHEST 1 VIEW COMPARISON:  01/17/2018 FINDINGS: Cardiomediastinal silhouette is normal. Interval near complete resolution of left pneumothorax post placement of left chest tube with minimal residual in the lung apex and base. Small left pleural effusion. Significant soft tissue emphysema of the left chest  wall. Rib fractures again noted. Osseous structures are without acute abnormality. Soft tissues are grossly normal. IMPRESSION: Minimal residual left pneumothorax, post chest tube placement. Small left pleural effusion. Posttraumatic soft tissue emphysema of the left chest wall with associated left-sided rib fractures. Electronically Signed   By: Ted Mcalpineobrinka  Dimitrova M.D.   On: 01/17/2018 16:04   Dg Chest Port 1 View  Result Date: 01/17/2018 CLINICAL DATA:  Unwitnessed fall from roof, left-sided chest pain, left shoulder pain EXAM: PORTABLE CHEST 1 VIEW COMPARISON:  Chest x-ray of 07/10/2016 FINDINGS: There is a left pneumothorax present of approximately 20%. There appear to be a rib fractures involving several lateral ribs possibly the left fourth, fifth, sixth, seventh and eighth ribs although these are not well visualized on the frontal view. There is left chest wall subcutaneous air present. The right lung is clear. Mild  cardiomegaly is stable. Old right fifth rib fracture is noted posteriorly. IMPRESSION: 1. Left pneumothorax or possibly 20% with associated left rib fractures probably involving the left fourth through eighth ribs. Left rib detail may be helpful if CT of the chest is not to be performed. 2.      The right lung is clear. 3.      Stable cardiomegaly. Electronically Signed   By: Dwyane Dee M.D.   On: 01/17/2018 13:33   Dg Humerus Left  Result Date: 01/17/2018 CLINICAL DATA:  Unwitnessed fall from roof, left-sided chest and shoulder pain EXAM: LEFT HUMERUS - 2+ VIEW COMPARISON:  None. FINDINGS: There does appear to be a fracture of the acromion of the left shoulder with slight widening of the [facility] [employer] joint. The acromion is also somewhat downward sloping. Left humeral head is intact and normally position and the left humerus appears intact. Subcutaneous left chest wall air is noted. The left pneumothorax again is noted. IMPRESSION: 1. Nondisplaced fracture of the left acromion with slightly widened  left AC joint. CT may be helpful to assess further if warranted. 2. No other fracture is seen. 3. Left pneumothorax is noted with left chest wall subcutaneous air. Adjacent left rib fractures are not as well seen. Electronically Signed   By: Dwyane Dee M.D.   On: 01/17/2018 13:36    Procedures CHEST TUBE INSERTION Date/Time: 01/19/2018 5:37 AM Performed by: Marily Memos, MD Authorized by: Marily Memos, MD   Consent:    Consent obtained:  Verbal and emergent situation   Consent given by:  Patient   Risks discussed:  Bleeding, incomplete drainage and infection   Alternatives discussed:  No treatment and delayed treatment Pre-procedure details:    Skin preparation:  ChloraPrep   Preparation: Patient was prepped and draped in the usual sterile fashion   Anesthesia (see MAR for exact dosages):    Anesthesia method:  Local infiltration   Local anesthetic:  Lidocaine 1% WITH epi Procedure details:    Placement location:  L lateral   Scalpel size:  11   Tube size (French): pigtail.   Ultrasound guidance: no     Tension pneumothorax: no     Tube connected to:  Water seal   Drainage characteristics:  Air only   Suture material:  0 silk   Dressing:  4x4 sterile gauze and Xeroform gauze Post-procedure details:    Post-insertion x-ray findings: tube in good position     Patient tolerance of procedure:  Tolerated well, no immediate complications .Critical Care Performed by: Marily Memos, MD Authorized by: Marily Memos, MD   Critical care provider statement:    Critical care time (minutes):  45   Critical care was necessary to treat or prevent imminent or life-threatening deterioration of the following conditions:  Trauma, shock and respiratory failure   Critical care was time spent personally by me on the following activities:  Discussions with consultants, evaluation of patient's response to treatment, examination of patient, ordering and performing treatments and interventions,  ordering and review of laboratory studies, ordering and review of radiographic studies, pulse oximetry, re-evaluation of patient's condition, obtaining history from patient or surrogate and review of old charts   (including critical care time)  Medications Ordered in ED Medications  lidocaine (XYLOCAINE) 1 % (with pres) injection 20 mL (has no administration in time range)  enoxaparin (LOVENOX) injection 40 mg (40 mg Subcutaneous Given 01/18/18 0936)  0.9 %  sodium chloride infusion ( Intravenous New Bag/Given 01/18/18 1942)  acetaminophen (TYLENOL) tablet 650 mg (650 mg Oral Given 01/19/18 0406)  docusate sodium (COLACE) capsule 100 mg (100 mg Oral Given 01/18/18 2106)  ondansetron (ZOFRAN-ODT) disintegrating tablet 4 mg (has no administration in time range)    Or  ondansetron (ZOFRAN) injection 4 mg (has no administration in time range)  pantoprazole (PROTONIX) EC tablet 40 mg (40 mg Oral Given 01/18/18 0936)  metoprolol tartrate (LOPRESSOR) injection 5 mg (has no administration in time range)  amLODipine (NORVASC) tablet 10 mg (10 mg Oral Given 01/18/18 0935)  atenolol (TENORMIN) tablet 25 mg (25 mg Oral Given 01/18/18 0936)  sertraline (ZOLOFT) tablet 25 mg (25 mg Oral Given 01/18/18 0936)  tamsulosin (FLOMAX) capsule 0.4 mg (0.4 mg Oral Given 01/18/18 1822)  methocarbamol (ROBAXIN) tablet 500 mg (500 mg Oral Given 01/19/18 0406)  traZODone (DESYREL) tablet 50 mg (50 mg Oral Given 01/17/18 2121)  traMADol (ULTRAM) tablet 50 mg (50 mg Oral Given 01/18/18 2306)  oxyCODONE (Oxy IR/ROXICODONE) immediate release tablet 10-15 mg (15 mg Oral Given 01/19/18 0406)  HYDROmorphone (DILAUDID) injection 0.5-1 mg (1 mg Intravenous Given 01/19/18 0052)  traZODone (DESYREL) tablet 25 mg (25 mg Oral Given 01/18/18 2105)  fentaNYL (SUBLIMAZE) injection ( Intravenous Canceled Entry 01/17/18 1330)  iohexol (OMNIPAQUE) 300 MG/ML solution 100 mL (100 mLs Intravenous Contrast Given 01/17/18 1443)  fentaNYL (SUBLIMAZE)  injection (50 mcg Intravenous Given 01/17/18 1420)  potassium chloride SA (K-DUR,KLOR-CON) CR tablet 20 mEq (20 mEq Oral Given 01/18/18 2106)  0.9 %  sodium chloride infusion (1,000 mLs Intravenous New Bag/Given 01/17/18 1305)     Initial Impression / Assessment and Plan / ED Course  I have reviewed the triage vital signs and the nursing notes.  Pertinent labs & imaging results that were available during my care of the patient were reviewed by me and considered in my medical decision making (see chart for details).  Patient found to have multiple fractures as above.  Level 2 trauma secondary to the mechanism but once patient found to be tachypneic with hypoxia crepitus and abnormal x-ray placed chest tube.  Had resolution of the symptoms.  Pain controlled with medications.  CT scans done and followed up by trauma team for admission.  Final Clinical Impressions(s) / ED Diagnoses   Final diagnoses:  Pneumothorax    ED Discharge Orders    None       Audine Mangione, Barbara CowerJason, MD 01/19/18 [phone]

## 2018-01-20 ENCOUNTER — Inpatient Hospital Stay (HOSPITAL_COMMUNITY): Payer: BLUE CROSS/BLUE SHIELD

## 2018-01-20 LAB — CBC WITH DIFFERENTIAL/PLATELET
Abs Immature Granulocytes: 0.21 10*3/uL — ABNORMAL HIGH (ref 0.00–0.07)
Basophils Absolute: 0 10*3/uL (ref 0.0–0.1)
Basophils Relative: 0 %
Eosinophils Absolute: 0.1 10*3/uL (ref 0.0–0.5)
Eosinophils Relative: 0 %
HCT: 31.8 % — ABNORMAL LOW (ref 39.0–52.0)
Hemoglobin: 10.6 g/dL — ABNORMAL LOW (ref 13.0–17.0)
Immature Granulocytes: 1 %
Lymphocytes Relative: 7 %
Lymphs Abs: 1.7 10*3/uL (ref 0.7–4.0)
MCH: 31.2 pg (ref 26.0–34.0)
MCHC: 33.3 g/dL (ref 30.0–36.0)
MCV: 93.5 fL (ref 80.0–100.0)
Monocytes Absolute: 1.2 10*3/uL — ABNORMAL HIGH (ref 0.1–1.0)
Monocytes Relative: 5 %
Neutro Abs: 22.5 10*3/uL — ABNORMAL HIGH (ref 1.7–7.7)
Neutrophils Relative %: 87 %
Platelets: 324 10*3/uL (ref 150–400)
RBC: 3.4 MIL/uL — ABNORMAL LOW (ref 4.22–5.81)
RDW: 13.9 % (ref 11.5–15.5)
WBC: 25.7 10*3/uL — ABNORMAL HIGH (ref 4.0–10.5)
nRBC: 0 % (ref 0.0–0.2)

## 2018-01-20 MED ORDER — GUAIFENESIN-DM 100-10 MG/5ML PO SYRP
5.0000 mL | ORAL_SOLUTION | Freq: Four times a day (QID) | ORAL | Status: DC
  Administered 2018-01-20 – 2018-01-26 (×23): 5 mL via ORAL
  Filled 2018-01-20 (×23): qty 5

## 2018-01-20 MED ORDER — DOCUSATE SODIUM 100 MG PO CAPS: 100.0000 mg | ORAL_CAPSULE | Freq: Two times a day (BID) | ORAL | Status: DC

## 2018-01-20 MED ORDER — OXYCODONE HCL 5 MG PO TABS: 5.0000 mg | ORAL_TABLET | Freq: Four times a day (QID) | ORAL | 0 refills | Status: AC | PRN

## 2018-01-20 MED ORDER — MAGNESIUM HYDROXIDE 400 MG/5ML PO SUSP: 30.0000 mL | Freq: Every day | ORAL | Status: DC | PRN

## 2018-01-20 MED ORDER — OXYCODONE HCL 10 MG PO TABS: 5.0000 mg | ORAL_TABLET | Freq: Four times a day (QID) | ORAL | 0 refills | Status: AC | PRN

## 2018-01-20 MED ORDER — POLYETHYLENE GLYCOL 3350 17 G PO PACK
17.0000 g | PACK | Freq: Every day | ORAL | Status: DC
  Administered 2018-01-20 – 2018-01-25 (×5): 17 g via ORAL
  Filled 2018-01-20 (×6): qty 1

## 2018-01-20 MED ORDER — TRAMADOL HCL 50 MG PO TABS: 50.0000 mg | ORAL_TABLET | Freq: Four times a day (QID) | ORAL | 1 refills | Status: AC | PRN

## 2018-01-20 MED ORDER — ALUM & MAG HYDROXIDE-SIMETH 200-200-20 MG/5ML PO SUSP
30.0000 mL | ORAL | Status: DC | PRN
  Administered 2018-01-20 – 2018-01-21 (×2): 30 mL via ORAL
  Filled 2018-01-20 (×2): qty 30

## 2018-01-20 NOTE — Progress Notes (Signed)
   Subjective/Chief Complaint: Complains of chest wall pain. Hasn't had bm in 3 days   Objective: Vital signs in last 24 hours: Temp:  [97.5 F (36.4 C)-99.2 F (37.3 C)] 97.5 F (36.4 C) (01/12 0552) Pulse Rate:  [81-87] 83 (01/12 0552) Resp:  [17-20] 20 (01/12 0552) BP: (122-163)/(77-94) 137/87 (01/12 0552) SpO2:  [89 %-99 %] 94 % (01/12 0552) Last BM Date: 01/16/18  Intake/Output from previous day: 01/11 0701 - 01/12 0700 In: 3153.4 [P.O.:470; I.V.:2683.4] Out: 1058 [Urine:1050; Chest Tube:8] Intake/Output this shift: No intake/output data recorded.  General appearance: alert and cooperative Resp: rhonchi bilaterally Cardio: regular rate and rhythm GI: soft, non-tender; bowel sounds normal; no masses,  no organomegaly  Lab Results:  Recent Labs    01/18/18 0537 01/19/18 0516  WBC 20.5* 26.0*  HGB 13.0 11.0*  HCT 37.8* 33.5*  PLT 400 328   BMET Recent Labs    01/18/18 0537 01/19/18 0516  NA 132* 132*  K 3.5 4.2  CL 99 103  CO2 22 20*  GLUCOSE 123* 130*  BUN 5* 12  CREATININE 0.73 0.75  CALCIUM 8.5* 8.4*   PT/INR Recent Labs    01/17/18 1333  LABPROT 12.5  INR 0.94   ABG No results for input(s): PHART, HCO3 in the last 72 hours.  Invalid input(s): PCO2, PO2  Studies/Results: Dg Chest Port 1 View  Result Date: 01/19/2018 CLINICAL DATA:  63 year old male with pneumothorax EXAM: PORTABLE CHEST 1 VIEW COMPARISON:  Chest x-ray yesterday, 01/18/2018 FINDINGS: Pigtail thoracostomy tube overlies the left chest. No definite residual left-sided pneumothorax. Persistent subcutaneous emphysema along the left chest wall extending into the soft tissues of the left neck. Ectatic ascending thoracic aorta. The heart contour is normal in size. Chronic bronchitic changes. No focal airspace consolidation or large pleural effusion. Remote healed right fifth rib fracture. No acute osseous abnormality. IMPRESSION: 1. Stable left pigtail thoracostomy tube without  definitive residual pneumothorax. 2. Persistent but improving subcutaneous emphysema. Electronically Signed   By: Malachy Moan M.D.   On: 01/19/2018 08:45    Anti-infectives: Anti-infectives (From admission, onward)   None      Assessment/Plan: s/p * No surgery found * Advance diet  Will add miralax and MOM  And colace for constipation Chest tube to water seal. Check cxr today Recheck wbc. Pt states it has been elevated for 2 years Pt's brother died yesterday and he wants to leave to go be with family  LOS: 3 days    Bryce Moore 01/20/2018

## 2018-01-20 NOTE — Plan of Care (Signed)
  Problem: Education: Goal: Knowledge of General Education information will improve Description Including pain rating scale, medication(s)/side effects and non-pharmacologic comfort measures Outcome: Progressing   

## 2018-01-21 ENCOUNTER — Inpatient Hospital Stay (HOSPITAL_COMMUNITY): Payer: BLUE CROSS/BLUE SHIELD

## 2018-01-21 LAB — CBC
HCT: 32.2 % — ABNORMAL LOW (ref 39.0–52.0)
Hemoglobin: 10.9 g/dL — ABNORMAL LOW (ref 13.0–17.0)
MCH: 31.7 pg (ref 26.0–34.0)
MCHC: 33.9 g/dL (ref 30.0–36.0)
MCV: 93.6 fL (ref 80.0–100.0)
Platelets: 352 10*3/uL (ref 150–400)
RBC: 3.44 MIL/uL — ABNORMAL LOW (ref 4.22–5.81)
RDW: 13.7 % (ref 11.5–15.5)
WBC: 21.8 10*3/uL — ABNORMAL HIGH (ref 4.0–10.5)
nRBC: 0 % (ref 0.0–0.2)

## 2018-01-21 MED ORDER — GERHARDT'S BUTT CREAM
TOPICAL_CREAM | Freq: Every day | CUTANEOUS | Status: DC | PRN
  Filled 2018-01-21: qty 1

## 2018-01-21 MED ORDER — MAGNESIUM CITRATE PO SOLN
0.5000 | Freq: Once | ORAL | Status: AC
  Administered 2018-01-21: 0.5 via ORAL
  Filled 2018-01-21: qty 296

## 2018-01-21 MED ORDER — SALINE SPRAY 0.65 % NA SOLN
1.0000 | NASAL | Status: DC | PRN
  Administered 2018-01-21: 1 via NASAL
  Filled 2018-01-21: qty 44

## 2018-01-21 NOTE — Clinical Social Work Note (Signed)
Clinical Social Work Assessment  Patient Details  Name: Bryce Moore MRN: [id number] Date of Birth: 1955-09-26  Date of referral:  01/21/18               Reason for consult:  Discharge Planning                Permission sought to share information with:  Case Manager Permission granted to share information::     Name::        Agency::  [facility]  Relationship::  Daughter  Contact Information:     Housing/Transportation Living arrangements for the past 2 months:  Single Family Home Source of Information:  Patient Patient Interpreter Needed:    Criminal Activity/Legal Involvement Pertinent to Current Situation/Hospitalization:    Significant Relationships:  Adult Children Lives with:  Self Do you feel safe going back to the place where you live?  Yes Need for family participation in patient care:  No (Coment)  Care giving concerns:    CSW completed Sbirt and spoke with patient about possible SNF as a back up option if he was declined for CIR. He stated that he would like to be admitted into inpatient rehab if it all possible. His family is wanting that as well. Patient is familiar with [facility] in [place]. If he does not receive authorization to attend CIR then [facility] in [place] would be his first choice. Would like to regain strength so that he is able to walk and resume his daily activities. No other concerns reported at this time.    Social Worker assessment / plan:    CSW met with patient at bedside and completed an sbirt and assessment. Patient wants to attend CIR for rehab. CSW will continue to follow patient.   Employment status:    Insurance informationEducational psychologist PT Recommendations:  Inpatient Rehab Consult Information / Referral to community resources:  [place]  Patient/Family's Response to care:  Family is understanding and supportive of the patient attending inpatient rehab.   Patient/Family's Understanding of and Emotional Response  to Diagnosis, Current Treatment, and Prognosis:  No other concerns reported at this time.   Emotional Assessment Appearance:  Appears stated age Attitude/Demeanor/Rapport:  Unable to Assess Affect (typically observed):  Unable to Assess Orientation:  Oriented to Self, Oriented to  Time, Oriented to Place, Oriented to Situation Alcohol / Substance use:  Not Applicable Psych involvement (Current and /or in the community):  No (Comment)  Discharge Needs  Concerns to be addressed:  Care Coordination Readmission within the last 30 days:  No Current discharge risk:    Barriers to Discharge:  Continued Medical Work up   [employer], [place] 01/21/2018, 12:18 PM

## 2018-01-21 NOTE — Progress Notes (Signed)
Inpatient Rehabilitation Admissions Coordinator  I met with patient, his wife, and daughter at bedside. Patient has had one sister who died since he was admitted, and another sister's death is expected soon. He is now aware that he will need an inpt rehab admit prior to d/c home and is in agreement,. He is requesting ortho follow up for additional surgery to his pelvis first. I will follow up tomorrow with surgical plan before proceeding with insurance authorization for  possible inpt rehab admit.  Danne Baxter, RN, MSN Rehab Admissions Coordinator [phone] 01/21/2018 1:51 PM

## 2018-01-21 NOTE — Progress Notes (Signed)
   01/21/18 1000  Clinical Encounter Type  Visited With Patient and family together;Patient not available  Visit Type Follow-up   Went for f/u visit w/ pt, a family member was also present.  Had learned in progression mtg that pt had a family member die during his stay and another was likely EOL.  Pt was occupied and asked that I return later.  Will attempt if emergent calls allow.  Pls page or enter consult in Epic if needed.  Margretta Sidle resident, [phone]

## 2018-01-21 NOTE — Progress Notes (Signed)
[facility] Progress Note      Assessment/Plan  Fall from roof Concussion- SLP  L T1 TVP fx- no back pain, PT/OT L rib fractures withHPTX- s/pCT removed 01/12, pain control, IS, pulm toilet LC2 pelvic fracture- per ortho, TDWB, ortho to see today, appreciate their assistance L shoulder fracture- per ortho, sling, NWB LUE Elevated LFT's- tbili normal, AST/ALT WNL, ALP trending down Hyponatremia- 132, improving HTN-home meds Hx of CAD s/p stent x2  FEN: reg diet, IVF VTE: SCDs, lovenox ID:no abx indicated at this time Follow up: Trauma, ortho Foley: none  Plan: PT/OT/Speech, pain control, pulm toilet, am labs, CIR pending. Have asked ortho to come by to discuss possible OR for pelvic fractures. 1/2 bt mag citrate   LOS: 4 days    Subjective: CC: pelvic pain  Pt states he would like to speak to orthopedics about surgery for his pelvis. He is having significant pain with ambulation. He states his sister died this weekend and he would like to leave. Daughter at bedside. No BM since admission. No issues overnight. No fever, chills, nausea or vomiting. Pt states he is pulling 500-750cc on IS.   Objective: Vital signs in last 24 hours: Temp:  [97.2 F (36.2 C)-99 F (37.2 C)] 97.6 F (36.4 C) (01/13 0500) Pulse Rate:  [74-83] 82 (01/13 0500) Resp:  [16-20] 20 (01/13 0500) BP: (136-156)/(78-89) 154/82 (01/13 0500) SpO2:  [92 %-97 %] 97 % (01/13 0500) Last BM Date: 01/24/18  Intake/Output from previous day: 01/12 0701 - 01/13 0700 In: 840 [P.O.:840] Out: 1050 [Urine:1050] Intake/Output this shift: No intake/output data recorded.  PE: Gen:  Alert, NAD, pleasant, cooperative Card:  RRR, no M/G/R heard Pulm:  Diminished breath sounds right base, no W/R/R, effort normal, 93% on RA Extremities: moves all 4's, LUE in sling, no edema of BLE, SCD's  Psych: no sensory deficits Skin: no rashes noted, warm and  dry   Anti-infectives: Anti-infectives (From admission, onward)   None      Lab Results:  Recent Labs    01/19/18 0516 01/20/18 0855  WBC 26.0* 25.7*  HGB 11.0* 10.6*  HCT 33.5* 31.8*  PLT 328 324   BMET Recent Labs    01/19/18 0516  NA 132*  K 4.2  CL 103  CO2 20*  GLUCOSE 130*  BUN 12  CREATININE 0.75  CALCIUM 8.4*   PT/INR No results for input(s): LABPROT, INR in the last 72 hours. CMP     Component Value Date/Time   NA 132 (L) 01/19/2018 0516   K 4.2 01/19/2018 0516   CL 103 01/19/2018 0516   CO2 20 (L) 01/19/2018 0516   GLUCOSE 130 (H) 01/19/2018 0516   BUN 12 01/19/2018 0516   CREATININE 0.75 01/19/2018 0516   CALCIUM 8.4 (L) 01/19/2018 0516   PROT 5.8 (L) 01/19/2018 0516   ALBUMIN 2.9 (L) 01/19/2018 0516   AST 35 01/19/2018 0516   ALT 43 01/19/2018 0516   ALKPHOS 155 (H) 01/19/2018 0516   BILITOT 0.5 01/19/2018 0516   GFRNONAA >60 01/19/2018 0516   GFRAA >60 01/19/2018 0516   Lipase  No results found for: LIPASE  Studies/Results: Dg Chest Port 1 View  Result Date: 01/21/2018 CLINICAL DATA:  Follow-up pneumothorax EXAM: PORTABLE CHEST 1 VIEW COMPARISON:  01/29/2018 FINDINGS: Cardiac shadow is mildly enlarged but stable. The lungs are well aerated bilaterally. No recurrent pneumothorax is noted on the left. Persistent subcutaneous emphysema is seen. Minimal right basilar atelectasis is again noted. IMPRESSION: Stable  appearance of the chest when compared with the prior study. Electronically Signed   By: Alcide CleverMark  Lukens M.D.   On: 01/21/2018 07:26   Dg Chest Port 1 View  Result Date: 01/20/2018 CLINICAL DATA:  LEFT chest tube removal. Follow-up subcutaneous emphysema and RIGHT basal atelectasis versus pneumonia. EXAM: PORTABLE CHEST 1 VIEW 3:07 p.m.: COMPARISON:  Chest x-ray earlier same day 7:43 a.m. and previously. FINDINGS: Interval LEFT small bore chest tube removal. No evidence of pneumothorax. Patchy opacities at the RIGHT lung base, unchanged  since earlier in the day. Lungs remain clear otherwise. Stable subcutaneous emphysema in the LEFT chest wall. Cardiac silhouette moderately enlarged, unchanged. Pulmonary vascularity normal. IMPRESSION: 1. No pneumothorax after LEFT chest tube removal. 2. Stable atelectasis and/or pneumonia at the RIGHT lung base since earlier in the day. 3. Stable subcutaneous emphysema in the LEFT chest wall. 4. Stable cardiomegaly without pulmonary edema. Electronically Signed   By: Hulan Saashomas  Lawrence M.D.   On: 01/20/2018 21:14   Dg Chest Port 1 View  Result Date: 01/20/2018 CLINICAL DATA:  Patient with chest tube in place. EXAM: PORTABLE CHEST 1 VIEW COMPARISON:  Chest radiograph 01/19/2018 FINDINGS: Monitoring leads overlie the patient. Cardiomegaly. Left chest tube remains in position. Slight interval increase in heterogeneous opacities right lung base. No pleural effusion or pneumothorax. Left chest wall subcutaneous emphysema. IMPRESSION: Left chest tube remains in position. Small amount of left chest wall subcutaneous emphysema. Increased opacities right lung base may represent atelectasis. Electronically Signed   By: Annia Beltrew  Davis M.D.   On: 01/20/2018 08:25      Jerre SimonJessica L Focht , [facility] Hospital[facility]-C [facility] 01/21/2018, 7:46 AM  Pager: [phone] Mon-Wed, Friday 7:00am-4:30pm Thurs 7am-11:30am  Consults: [phone]

## 2018-01-21 NOTE — Progress Notes (Signed)
Completed Sbirt with the patient. Patient reports that he does not have an issue with drinking. Patient denies any recreational drug use. Patient disclosed that he was not drinking on the day of his fall. He stated that he may have 2-3 beers a week. He stated that he does not drink like he used to. Family is not concerned with alcohol use.   Completed assessment with patient. If patient is denied CIR, patient's first SNF choice would be [facility] in [place].   CSW will continue to follow.   Drucilla Schmidt, MSW, LCSW-A Clinical Social Worker [employer]

## 2018-01-21 NOTE — Progress Notes (Signed)
Will eval patient tomorrow morning after repeat x-rays done tonight. Will discuss possible percutaneous fixation of his pelvis. Patient made NPO after midnight. I will see patient first thing in AM.  Roby Lofts, MD [facility] [phone] (phone)

## 2018-01-21 NOTE — Plan of Care (Signed)
  Problem: Education: Goal: Knowledge of General Education information will improve Description Including pain rating scale, medication(s)/side effects and non-pharmacologic comfort measures Outcome: Progressing   

## 2018-01-21 NOTE — Progress Notes (Signed)
Physical Therapy Treatment Patient Details Name: Bryce InaDanny R Neises MRN: [id number] DOB: [birth year] Today's Date: 01/21/2018    History of Present Illness Pt is a 63 yo male s/p fall from roof sustaining L shoulder fx, NWB in sling; L C2 pelvic fx TDWB; Left T1 TVP fx, but does not report back pain; and rib pain. Pt has no recollection of fall. PMHx: HTN    PT Comments    Pt agreeable to therapy, however state how tired he is of being in his room. Focus of therapy became transfers to and from the wheelchair and wheelchair propulsion. Pt continues to be impulsive, not waiting until therapist ready for transfer and bearing weight through his L LE. Pt is maxAx2 for lateral scoot transfers to and from wheelchair. Pt is generally min guard for wheelchair propulsion. D/c plans remain appropriate at this time.   Follow Up Recommendations  CIR;Supervision/Assistance - 24 hour     Equipment Recommendations  Wheelchair (measurements PT)    Recommendations for Other Services Rehab consult     Precautions / Restrictions Precautions Precautions: Fall Restrictions Weight Bearing Restrictions: Yes LUE Weight Bearing: Non weight bearing LLE Weight Bearing: Touchdown weight bearing Other Position/Activity Restrictions: Not an easy position for pt to abide by    Mobility  Bed Mobility               General bed mobility comments: in recliner upon arrival  Transfers Overall transfer level: Needs assistance Equipment used: Rolling walker (2 wheeled)(w/c) Transfers: Lateral/Scoot Transfers;Squat Pivot Transfers Sit to Stand: Max assist;+2 physical assistance;+2 safety/equipment   Squat pivot transfers: Max assist;+2 physical assistance;+2 safety/equipment    Lateral/Scoot Transfers: +2 physical assistance;Max assist General transfer comment: Pt non compliant at this time with TDWB on LLE; lateral scoots the best way; had to perform squat pivot transfer to get in correct recliner with drop  arm.            Merchant navy officerWheelchair Mobility Wheelchair Mobility Wheelchair mobility: Yes Wheelchair propulsion: Right upper extremity;Right lower extremity Wheelchair parts: Needs assistance Distance: 50 Wheelchair Assistance Details (indicate cue type and reason): requires assist with turning and tires easily   Modified Rankin (Stroke Patients Only)       Balance Overall balance assessment: Needs assistance Sitting-balance support: Single extremity supported Sitting balance-Leahy Scale: Fair       Standing balance-Leahy Scale: Poor                              Cognition Arousal/Alertness: Awake/alert Behavior During Therapy: Restless Overall Cognitive Status: Impaired/Different from baseline Area of Impairment: Following commands;Safety/judgement                       Following Commands: Follows one step commands inconsistently(continues to be impulsive and impatient with movement) Safety/Judgement: Decreased awareness of safety;Decreased awareness of deficits            Exercises      General Comments General comments (skin integrity, edema, etc.): redness and areas opening on pt's bottom; cream applied      Pertinent Vitals/Pain Pain Assessment: 0-10 Pain Score: 8  Pain Location: ribs and chest Pain Descriptors / Indicators: Crushing Pain Intervention(s): Limited activity within patient's tolerance;Monitored during session;Repositioned           PT Goals (current goals can now be found in the care plan section) Acute Rehab PT Goals Patient Stated Goal: to get back to work PT  Goal Formulation: With patient Time For Goal Achievement: 02/01/18 Potential to Achieve Goals: Good Progress towards PT goals: Progressing toward goals    Frequency    Min 3X/week      PT Plan Current plan remains appropriate    Co-evaluation PT/OT/SLP Co-Evaluation/Treatment: Yes Reason for Co-Treatment: Complexity of the patient's impairments  (multi-system involvement) PT goals addressed during session: Mobility/safety with mobility;Proper use of DME OT goals addressed during session: ADL's and self-care;Proper use of Adaptive equipment and DME      AM-PAC PT "6 Clicks" Mobility   Outcome Measure  Help needed turning from your back to your side while in a flat bed without using bedrails?: A Lot Help needed moving from lying on your back to sitting on the side of a flat bed without using bedrails?: A Lot Help needed moving to and from a bed to a chair (including a wheelchair)?: A Lot Help needed standing up from a chair using your arms (e.g., wheelchair or bedside chair)?: Total Help needed to walk in hospital room?: Total Help needed climbing 3-5 steps with a railing? : Total 6 Click Score: 9    End of Session Equipment Utilized During Treatment: Gait belt Activity Tolerance: Patient tolerated treatment well;Patient limited by pain Patient left: in chair;with call bell/phone within reach;with chair alarm set;with family/visitor present Nurse Communication: Mobility status PT Visit Diagnosis: Unsteadiness on feet (R26.81);Other abnormalities of gait and mobility (R26.89);Muscle weakness (generalized) (M62.81);History of falling (Z91.81)     Time: 1610-9604 PT Time Calculation (min) (ACUTE ONLY): 31 min  Charges:  $Wheel Chair Management: 8-22 mins                     Teckla Christiansen B. Beverely Risen PT, DPT Acute Rehabilitation Services Pager [phone] Office [phone]    Elon Alas [place] 01/21/2018, 4:45 PM

## 2018-01-21 NOTE — Progress Notes (Signed)
Occupational Therapy Treatment Patient Details Name: Bryce Moore MRN: [id number] DOB: 1955-12-27 Today's Date: 01/21/2018    History of present illness Pt is a 63 yo male s/p fall from roof sustaining L shoulder fx, NWB in sling; L C2 pelvic fx TDWB; Left T1 TVP fx, but does not report back pain; and rib pain. Pt has no recollection of fall. PMHx: HTN   OT comments  Pt is a 63 yo male s/p ADL functional transfer training and W/C training as pt continues to be limited by NWB LUE in sling and TDWB on LLE. Pt performing lateral scoots with ModA +2 for safety and pt fatigues very easily. Pt requires multiple cues to wait for therapists to be ready for next move. Pt performing transfers with poor to fair standing balance with RW in RUE and improved ability to perform TDWB status for squat pivot transfers from drop arm recliner to drop arm w/c. Pt progressing , but very limited by pain. Pr would benefit from continued OT skilled services for ADL, mobility and safety in CIR setting with 24/7 supervision.   Follow Up Recommendations  CIR;Supervision/Assistance - 24 hour    Equipment Recommendations  3 in 1 bedside commode;Wheelchair (measurements OT);Tub/shower seat    Recommendations for Other Services PT consult;Rehab consult    Precautions / Restrictions Precautions Precautions: Fall Restrictions Weight Bearing Restrictions: Yes LUE Weight Bearing: Non weight bearing LLE Weight Bearing: Touchdown weight bearing Other Position/Activity Restrictions: Not an easy position for pt to abide by       Mobility Bed Mobility               General bed mobility comments: in recliner upon arrival  Transfers Overall transfer level: Needs assistance Equipment used: Rolling walker (2 wheeled)(w/c) Transfers: Lateral/Scoot Transfers;Squat Pivot Transfers Sit to Stand: Max assist;+2 physical assistance;+2 safety/equipment   Squat pivot transfers: Max assist;+2 physical assistance;+2  safety/equipment    Lateral/Scoot Transfers: +2 physical assistance;Max assist General transfer comment: Pt non compliant at this time with TDWB on LLE; lateral scoots the best way; had to perform squat pivot transfer to get in correct recliner with drop arm.    Balance Overall balance assessment: Needs assistance Sitting-balance support: Single extremity supported Sitting balance-Leahy Scale: Fair       Standing balance-Leahy Scale: Poor                             ADL either performed or assessed with clinical judgement   ADL Overall ADL's : Needs assistance/impaired                                     Functional mobility during ADLs: Maximal assistance;+2 for physical assistance;+2 for safety/equipment;Cueing for safety;Cueing for sequencing;Wheelchair General ADL Comments: Min to moDA for UB ADL; MaxA LB ADL- ribs and chest severe pain     Vision   Vision Assessment?: No apparent visual deficits   Perception     Praxis      Cognition Arousal/Alertness: Awake/alert Behavior During Therapy: Restless Overall Cognitive Status: Impaired/Different from baseline Area of Impairment: Following commands;Safety/judgement                       Following Commands: Follows one step commands inconsistently(continues to be impulsive and impatient with movement) Safety/Judgement: Decreased awareness of safety;Decreased awareness of deficits  Exercises     Shoulder Instructions       General Comments redness and areas opening on pt's bottom; cream applied    Pertinent Vitals/ Pain       Pain Assessment: 0-10 Pain Score: 8  Pain Location: ribs and chest Pain Descriptors / Indicators: Crushing Pain Intervention(s): Limited activity within patient's tolerance  Home Living                                          Prior Functioning/Environment              Frequency  Min 2X/week         Progress Toward Goals  OT Goals(current goals can now be found in the care plan section)  Progress towards OT goals: Progressing toward goals  Acute Rehab OT Goals Patient Stated Goal: to get back to work OT Goal Formulation: With patient Time For Goal Achievement: 02/01/18 Potential to Achieve Goals: Good ADL Goals Pt Will Perform Grooming: with modified independence;sitting Pt Will Perform Upper Body Dressing: with supervision Pt Will Perform Lower Body Dressing: with min assist;bed level;sitting/lateral leans Pt Will Transfer to Toilet: with mod assist;with transfer board;bedside commode Pt/caregiver will Perform Home Exercise Program: Increased ROM;Both right and left upper extremity;With Supervision Additional ADL Goal #1: Pt will perform stand pivot or lateral slide transfers from one surface to another with ModA.  Plan Discharge plan remains appropriate    Co-evaluation    PT/OT/SLP Co-Evaluation/Treatment: Yes Reason for Co-Treatment: Complexity of the patient's impairments (multi-system involvement);To address functional/ADL transfers   OT goals addressed during session: ADL's and self-care;Proper use of Adaptive equipment and DME      AM-PAC OT "6 Clicks" Daily Activity     Outcome Measure   Help from another person eating meals?: A Little Help from another person taking care of personal grooming?: A Little Help from another person toileting, which includes using toliet, bedpan, or urinal?: A Lot Help from another person bathing (including washing, rinsing, drying)?: A Lot Help from another person to put on and taking off regular upper body clothing?: A Little Help from another person to put on and taking off regular lower body clothing?: Total 6 Click Score: 14    End of Session Equipment Utilized During Treatment: Rolling walker;Other (comment)(w/c)  OT Visit Diagnosis: Unsteadiness on feet (R26.81);Muscle weakness (generalized) (M62.81);Other abnormalities of  gait and mobility (R26.89);Pain Pain - Right/Left: Left Pain - part of body: Shoulder;Leg   Activity Tolerance Patient limited by pain;Treatment limited secondary to agitation;Treatment limited secondary to medical complications (Comment)   Patient Left in chair;with call bell/phone within reach;with chair alarm set;with family/visitor present   Nurse Communication Mobility status;Weight bearing status        Time: 1409-1440 OT Time Calculation (min): 31 min  Charges: OT General Charges $OT Visit: 1 Visit OT Treatments $Neuromuscular Re-education: 8-22 mins  Cristi LoronAllison (Jelenek) Gl[place]ell [place] OTR/L Acute Rehabilitation Services Pager: [phone] Office: [phone]   Sandrea HughsLLYSON  JELENEK 01/21/2018, 3:53 PM

## 2018-01-22 ENCOUNTER — Encounter (HOSPITAL_COMMUNITY): Admission: EM | Disposition: A | Discharge status: Home Health | Payer: Self-pay | Source: Home / Self Care

## 2018-01-22 ENCOUNTER — Inpatient Hospital Stay (HOSPITAL_COMMUNITY): Payer: BLUE CROSS/BLUE SHIELD

## 2018-01-22 ENCOUNTER — Inpatient Hospital Stay (HOSPITAL_COMMUNITY): Payer: BLUE CROSS/BLUE SHIELD | Admitting: Anesthesiology

## 2018-01-22 ENCOUNTER — Encounter (HOSPITAL_COMMUNITY): Payer: Self-pay | Admitting: Certified Registered"

## 2018-01-22 HISTORY — PX: ORIF PELVIC FRACTURE WITH PERCUTANEOUS SCREWS: SHX6800

## 2018-01-22 LAB — COMPREHENSIVE METABOLIC PANEL
ALT: 28 U/L (ref 0–44)
AST: 20 U/L (ref 15–41)
Albumin: 2.4 g/dL — ABNORMAL LOW (ref 3.5–5.0)
Alkaline Phosphatase: 144 U/L — ABNORMAL HIGH (ref 38–126)
Anion gap: 7 (ref 5–15)
BUN: 8 mg/dL (ref 8–23)
CO2: 25 mmol/L (ref 22–32)
Calcium: 8.2 mg/dL — ABNORMAL LOW (ref 8.9–10.3)
Chloride: 103 mmol/L (ref 98–111)
Creatinine, Ser: 0.64 mg/dL (ref 0.61–1.24)
GFR calc Af Amer: >60 mL/min (ref >60)
GFR calc non Af Amer: >60 mL/min (ref >60)
Glucose, Bld: 110 mg/dL — ABNORMAL HIGH (ref 70–99)
Potassium: 3.5 mmol/L (ref 3.5–5.1)
Sodium: 135 mmol/L (ref 135–145)
Total Bilirubin: 0.8 mg/dL (ref 0.3–1.2)
Total Protein: 5.7 g/dL — ABNORMAL LOW (ref 6.5–8.1)

## 2018-01-22 LAB — CBC
HCT: 30.2 % — ABNORMAL LOW (ref 39.0–52.0)
Hemoglobin: 10.1 g/dL — ABNORMAL LOW (ref 13.0–17.0)
MCH: 30.6 pg (ref 26.0–34.0)
MCHC: 33.4 g/dL (ref 30.0–36.0)
MCV: 91.5 fL (ref 80.0–100.0)
Platelets: 354 10*3/uL (ref 150–400)
RBC: 3.3 MIL/uL — ABNORMAL LOW (ref 4.22–5.81)
RDW: 13.4 % (ref 11.5–15.5)
WBC: 18.4 10*3/uL — ABNORMAL HIGH (ref 4.0–10.5)
nRBC: 0 % (ref 0.0–0.2)

## 2018-01-22 SURGERY — CLOSED REDUCTION, PELVIS, WITH PERCUTANEOUS FIXATION
Anesthesia: General | Site: Pelvis | Laterality: Left

## 2018-01-22 MED ORDER — ROCURONIUM BROMIDE 50 MG/5ML IV SOSY
PREFILLED_SYRINGE | INTRAVENOUS | Status: AC
  Filled 2018-01-22: qty 5

## 2018-01-22 MED ORDER — 0.9 % SODIUM CHLORIDE (POUR BTL) OPTIME
TOPICAL | Status: DC | PRN
  Administered 2018-01-22: 1000 mL

## 2018-01-22 MED ORDER — FENTANYL CITRATE (PF) 100 MCG/2ML IJ SOLN
INTRAMUSCULAR | Status: DC | PRN
  Administered 2018-01-22: 50 ug via INTRAVENOUS
  Administered 2018-01-22: 100 ug via INTRAVENOUS

## 2018-01-22 MED ORDER — LACTATED RINGERS IV SOLN
INTRAVENOUS | Status: DC
  Administered 2018-01-22 (×2): via INTRAVENOUS

## 2018-01-22 MED ORDER — ONDANSETRON HCL 4 MG/2ML IJ SOLN
INTRAMUSCULAR | Status: AC
  Filled 2018-01-22: qty 2

## 2018-01-22 MED ORDER — MIDAZOLAM HCL 2 MG/2ML IJ SOLN
INTRAMUSCULAR | Status: DC | PRN
  Administered 2018-01-22: 2 mg via INTRAVENOUS

## 2018-01-22 MED ORDER — OXYCODONE HCL 5 MG PO TABS: 5.0000 mg | ORAL_TABLET | Freq: Once | ORAL | Status: DC | PRN

## 2018-01-22 MED ORDER — FENTANYL CITRATE (PF) 100 MCG/2ML IJ SOLN
25.0000 ug | INTRAMUSCULAR | Status: DC | PRN
  Administered 2018-01-22 (×2): 50 ug via INTRAVENOUS

## 2018-01-22 MED ORDER — ALBUMIN HUMAN 5 % IV SOLN
INTRAVENOUS | Status: DC | PRN
  Administered 2018-01-22: 18:00:00 via INTRAVENOUS

## 2018-01-22 MED ORDER — CEFAZOLIN SODIUM-DEXTROSE 2-4 GM/100ML-% IV SOLN
2.0000 g | Freq: Three times a day (TID) | INTRAVENOUS | Status: AC
  Administered 2018-01-23 (×3): 2 g via INTRAVENOUS
  Filled 2018-01-22 (×4): qty 100

## 2018-01-22 MED ORDER — SUGAMMADEX SODIUM 200 MG/2ML IV SOLN
INTRAVENOUS | Status: DC | PRN
  Administered 2018-01-22: 200 mg via INTRAVENOUS

## 2018-01-22 MED ORDER — CALCIUM CARBONATE ANTACID 500 MG PO CHEW: 2.0000 | CHEWABLE_TABLET | Freq: Three times a day (TID) | ORAL | Status: DC | PRN

## 2018-01-22 MED ORDER — CEFAZOLIN SODIUM 1 G IJ SOLR
INTRAMUSCULAR | Status: AC
  Filled 2018-01-22: qty 20

## 2018-01-22 MED ORDER — ROCURONIUM BROMIDE 50 MG/5ML IV SOSY
PREFILLED_SYRINGE | INTRAVENOUS | Status: DC | PRN
  Administered 2018-01-22: 50 mg via INTRAVENOUS
  Administered 2018-01-22: 30 mg via INTRAVENOUS

## 2018-01-22 MED ORDER — FENTANYL CITRATE (PF) 100 MCG/2ML IJ SOLN
INTRAMUSCULAR | Status: AC
  Filled 2018-01-22: qty 2

## 2018-01-22 MED ORDER — PROPOFOL 10 MG/ML IV BOLUS
INTRAVENOUS | Status: DC | PRN
  Administered 2018-01-22: 120 mg via INTRAVENOUS

## 2018-01-22 MED ORDER — ONDANSETRON HCL 4 MG/2ML IJ SOLN: 4.0000 mg | Freq: Four times a day (QID) | INTRAMUSCULAR | Status: DC | PRN

## 2018-01-22 MED ORDER — PROPOFOL 10 MG/ML IV BOLUS
INTRAVENOUS | Status: AC
  Filled 2018-01-22: qty 20

## 2018-01-22 MED ORDER — CEFAZOLIN SODIUM-DEXTROSE 2-3 GM-%(50ML) IV SOLR
INTRAVENOUS | Status: DC | PRN
  Administered 2018-01-22: 2 g via INTRAVENOUS

## 2018-01-22 MED ORDER — MIDAZOLAM HCL 2 MG/2ML IJ SOLN
INTRAMUSCULAR | Status: AC
  Filled 2018-01-22: qty 2

## 2018-01-22 MED ORDER — LIDOCAINE 2% (20 MG/ML) 5 ML SYRINGE
INTRAMUSCULAR | Status: DC | PRN
  Administered 2018-01-22: 50 mg via INTRAVENOUS

## 2018-01-22 MED ORDER — ENOXAPARIN SODIUM 40 MG/0.4ML ~~LOC~~ SOLN
40.0000 mg | SUBCUTANEOUS | Status: DC
  Administered 2018-01-23 – 2018-01-26 (×4): 40 mg via SUBCUTANEOUS
  Filled 2018-01-22 (×4): qty 0.4

## 2018-01-22 MED ORDER — OXYCODONE HCL 5 MG/5ML PO SOLN: 5.0000 mg | Freq: Once | ORAL | Status: DC | PRN

## 2018-01-22 MED ORDER — DEXAMETHASONE SODIUM PHOSPHATE 10 MG/ML IJ SOLN
INTRAMUSCULAR | Status: DC | PRN
  Administered 2018-01-22: 10 mg via INTRAVENOUS

## 2018-01-22 MED ORDER — LIDOCAINE 2% (20 MG/ML) 5 ML SYRINGE
INTRAMUSCULAR | Status: AC
  Filled 2018-01-22: qty 5

## 2018-01-22 MED ORDER — ONDANSETRON HCL 4 MG/2ML IJ SOLN
INTRAMUSCULAR | Status: DC | PRN
  Administered 2018-01-22: 4 mg via INTRAVENOUS

## 2018-01-22 MED ORDER — VANCOMYCIN HCL 1000 MG IV SOLR
INTRAVENOUS | Status: AC
  Filled 2018-01-22: qty 1000

## 2018-01-22 MED ORDER — DEXAMETHASONE SODIUM PHOSPHATE 10 MG/ML IJ SOLN
INTRAMUSCULAR | Status: AC
  Filled 2018-01-22: qty 1

## 2018-01-22 MED ORDER — PHENYLEPHRINE 40 MCG/ML (10ML) SYRINGE FOR IV PUSH (FOR BLOOD PRESSURE SUPPORT)
PREFILLED_SYRINGE | INTRAVENOUS | Status: DC | PRN
  Administered 2018-01-22 (×2): 120 ug via INTRAVENOUS

## 2018-01-22 MED ORDER — FENTANYL CITRATE (PF) 250 MCG/5ML IJ SOLN
INTRAMUSCULAR | Status: AC
  Filled 2018-01-22: qty 5

## 2018-01-22 MED ORDER — SODIUM CHLORIDE 0.9 % IV SOLN
INTRAVENOUS | Status: DC | PRN
  Administered 2018-01-22: 25 ug/min via INTRAVENOUS

## 2018-01-22 MED ORDER — PHENYLEPHRINE 40 MCG/ML (10ML) SYRINGE FOR IV PUSH (FOR BLOOD PRESSURE SUPPORT)
PREFILLED_SYRINGE | INTRAVENOUS | Status: AC
  Filled 2018-01-22: qty 10

## 2018-01-22 SURGICAL SUPPLY — 51 items
ADH SKN CLS APL DERMABOND .7 (GAUZE/BANDAGES/DRESSINGS) ×1
ADH SKN CLS LQ APL DERMABOND (GAUZE/BANDAGES/DRESSINGS) ×1
BIT DRILL CANN 4.5MM (BIT) IMPLANT
BLADE CLIPPER SURG (BLADE) ×1 IMPLANT
BLADE SURG 11 STRL SS (BLADE) ×2 IMPLANT
CHLORAPREP W/TINT 26ML (MISCELLANEOUS) ×2 IMPLANT
COVER WAND RF STERILE (DRAPES) ×2 IMPLANT
DERMABOND ADHESIVE PROPEN (GAUZE/BANDAGES/DRESSINGS) ×1
DERMABOND ADVANCED (GAUZE/BANDAGES/DRESSINGS) ×1
DERMABOND ADVANCED .7 DNX12 (GAUZE/BANDAGES/DRESSINGS) IMPLANT
DERMABOND ADVANCED .7 DNX6 (GAUZE/BANDAGES/DRESSINGS) IMPLANT
DRAPE C-ARM 42X72 X-RAY (DRAPES) ×2 IMPLANT
DRAPE C-ARMOR (DRAPES) ×2 IMPLANT
DRAPE INCISE IOBAN 66X45 STRL (DRAPES) ×2 IMPLANT
DRAPE PROXIMA HALF (DRAPES) ×2 IMPLANT
DRAPE SURG 17X23 STRL (DRAPES) ×10 IMPLANT
DRAPE U-SHAPE 47X51 STRL (DRAPES) ×2 IMPLANT
DRAPE UNIVERSAL PACK (DRAPES) ×2 IMPLANT
DRILL BIT CANN 4.5MM (BIT) ×2
DRSG MEPILEX BORDER 4X4 (GAUZE/BANDAGES/DRESSINGS) ×2 IMPLANT
DRSG MEPILEX BORDER 4X8 (GAUZE/BANDAGES/DRESSINGS) IMPLANT
DRSG TEGADERM 2-3/8X2-3/4 SM (GAUZE/BANDAGES/DRESSINGS) ×1 IMPLANT
ELECT REM PT RETURN 9FT ADLT (ELECTROSURGICAL) ×2
ELECTRODE REM PT RTRN 9FT ADLT (ELECTROSURGICAL) ×1 IMPLANT
GLOVE BIO SURGEON STRL SZ 6.5 (GLOVE) ×6 IMPLANT
GLOVE BIO SURGEON STRL SZ7.5 (GLOVE) ×8 IMPLANT
GLOVE BIOGEL PI IND STRL 6.5 (GLOVE) ×1 IMPLANT
GLOVE BIOGEL PI IND STRL 7.5 (GLOVE) ×1 IMPLANT
GLOVE BIOGEL PI INDICATOR 6.5 (GLOVE) ×1
GLOVE BIOGEL PI INDICATOR 7.5 (GLOVE) ×1
GOWN STRL REUS W/ TWL LRG LVL3 (GOWN DISPOSABLE) ×2 IMPLANT
GOWN STRL REUS W/TWL LRG LVL3 (GOWN DISPOSABLE) ×4
GUIDEWIRE 2.0MM (WIRE) ×2 IMPLANT
GUIDEWIRE THREADED 2.8MM (WIRE) ×2 IMPLANT
KIT BASIN OR (CUSTOM PROCEDURE TRAY) ×2 IMPLANT
KIT TURNOVER KIT B (KITS) ×2 IMPLANT
MANIFOLD NEPTUNE II (INSTRUMENTS) ×2 IMPLANT
NS IRRIG 1000ML POUR BTL (IV SOLUTION) ×2 IMPLANT
PACK TOTAL JOINT (CUSTOM PROCEDURE TRAY) ×2 IMPLANT
PAD ARMBOARD 7.5X6 YLW CONV (MISCELLANEOUS) ×4 IMPLANT
SCREW CANN LOCK FT 7.3X160 (Screw) ×1 IMPLANT
SCREW CANN THRD 6.5X125 (Screw) ×1 IMPLANT
SPONGE LAP 18X18 X RAY DECT (DISPOSABLE) ×1 IMPLANT
STAPLER VISISTAT 35W (STAPLE) ×2 IMPLANT
SUCTION FRAZIER HANDLE 10FR (MISCELLANEOUS) ×1
SUCTION TUBE FRAZIER 10FR DISP (MISCELLANEOUS) ×1 IMPLANT
SUT MNCRL AB 3-0 PS2 18 (SUTURE) ×2 IMPLANT
SUT MON AB 2-0 CT1 36 (SUTURE) ×2 IMPLANT
TRAY FOLEY MTR SLVR 16FR STAT (SET/KITS/TRAYS/PACK) ×1 IMPLANT
WASHER FOR 5.0 SCREWS (Washer) ×1 IMPLANT
WATER STERILE IRR 1000ML POUR (IV SOLUTION) ×2 IMPLANT

## 2018-01-22 NOTE — Transfer of Care (Signed)
Immediate Anesthesia Transfer of Care Note  Patient: Bryce Moore  Procedure(s) Performed: ORIF PELVIC FRACTURE WITH PERCUTANEOUS SCREWS (Left Pelvis)  Patient Location: PACU  Anesthesia Type:General  Level of Consciousness: awake, alert , oriented and patient cooperative  Airway & Oxygen Therapy: Patient Spontanous Breathing and Patient connected to nasal cannula oxygen  Post-op Assessment: Report given to RN, Post -op Vital signs reviewed and stable and Patient moving all extremities X 4  Post vital signs: Reviewed and stable  Last Vitals:  Vitals Value Taken Time  BP 139/74 01/22/2018  7:05 PM  Temp    Pulse 72 01/22/2018  7:08 PM  Resp 28 01/22/2018  7:08 PM  SpO2 93 % 01/22/2018  7:08 PM  Vitals shown include unvalidated device data.  Last Pain:  Vitals:   01/22/18 1906  TempSrc:   PainSc: (P) Asleep      Patients Stated Pain Goal: 0 (01/21/18 1952)  Complications: No apparent anesthesia complications

## 2018-01-22 NOTE — Progress Notes (Signed)
Assessment by Ferdie PingJoshua Johnson, RN.

## 2018-01-22 NOTE — Progress Notes (Signed)
EKG being done

## 2018-01-22 NOTE — Anesthesia Procedure Notes (Signed)
Procedure Name: Intubation Date/Time: 01/22/2018 5:46 PM Performed by: Barrington Ellison, CRNA Pre-anesthesia Checklist: Patient identified, Emergency Drugs available, Suction available and Patient being monitored Patient Re-evaluated:Patient Re-evaluated prior to induction Oxygen Delivery Method: Circle System Utilized Preoxygenation: Pre-oxygenation with 100% oxygen Induction Type: IV induction Ventilation: Mask ventilation without difficulty Laryngoscope Size: Mac and 3 Grade View: Grade I Tube type: Oral Tube size: 7.5 mm Number of attempts: 1 Airway Equipment and Method: Stylet and Oral airway Placement Confirmation: ETT inserted through vocal cords under direct vision,  positive ETCO2 and breath sounds checked- equal and bilateral Secured at: 22 cm Tube secured with: Tape Dental Injury: Teeth and Oropharynx as per pre-operative assessment

## 2018-01-22 NOTE — Progress Notes (Signed)
[facility]/Trauma Progress Note      Assessment/Plan Fall from roof Concussion- SLP  L T1 TVP fx- no back pain, PT/OT L rib fractures withHPTX- s/pCT removed 01/12, pain control, IS, pulm toilet LC2 pelvic fracture- per ortho, TDWB OR today with ortho L shoulder fracture- per ortho, sling, NWB LUE Elevated LFT's- tbili normal, AST/ALT WNL, ALP trending down Hyponatremia- 135, resolved HTN-home meds Hx of CAD s/p stent x2  FEN: reg diet, IVF VTE: SCDs, lovenox ID:no abx indicated at this time, WBC trending down, diff showed toxic granulation  Follow up: Trauma, ortho Foley: none  Plan: PT/OT/Speech, pain control, pulm toilet, am CBC, CIR pending. OR today with ortho for pelvic fixation   LOS: 5 days    Subjective: CC: cough at night when laying down  Cough is not productive. Keeps him awake. He has a hx of GERD. No fever, chills, nausea or vomiting. No numbness or tingling. Family is concerned about feet swelling. Had a BM.   Objective: Vital signs in last 24 hours: Temp:  [98 F (36.7 C)-98.9 F (37.2 C)] 98.9 F (37.2 C) (01/14 0526) Pulse Rate:  [63-82] 82 (01/14 0526) Resp:  [17-22] 22 (01/14 0526) BP: (115-137)/(70-86) 131/86 (01/14 0526) SpO2:  [90 %-97 %] 90 % (01/14 0526) Last BM Date: 01/21/18  Intake/Output from previous day: 01/13 0701 - 01/14 0700 In: 0  Out: 700 [Urine:700] Intake/Output this shift: No intake/output data recorded.  PE: Gen:  Alert, NAD, pleasant, cooperative Card:  RRR, no M/G/R heard Pulm:  Diminished breath sounds right base, no W/R/R, effort normal Extremities: moves all 4's, LUE in sling, no edema of BLE, SCD's in place Psych: no sensory deficits Skin: no rashes noted, warm and dry   Anti-infectives: Anti-infectives (From admission, onward)   None      Lab Results:  Recent Labs    01/21/18 0847 01/22/18 0337  WBC 21.8* 18.4*  HGB 10.9* 10.1*  HCT 32.2* 30.2*  PLT 352 354    BMET Recent Labs    01/22/18 0337  NA 135  K 3.5  CL 103  CO2 25  GLUCOSE 110*  BUN 8  CREATININE 0.64  CALCIUM 8.2*   PT/INR No results for input(s): LABPROT, INR in the last 72 hours. CMP     Component Value Date/Time   NA 135 01/22/2018 0337   K 3.5 01/22/2018 0337   CL 103 01/22/2018 0337   CO2 25 01/22/2018 0337   GLUCOSE 110 (H) 01/22/2018 0337   BUN 8 01/22/2018 0337   CREATININE 0.64 01/22/2018 0337   CALCIUM 8.2 (L) 01/22/2018 0337   PROT 5.7 (L) 01/22/2018 0337   ALBUMIN 2.4 (L) 01/22/2018 0337   AST 20 01/22/2018 0337   ALT 28 01/22/2018 0337   ALKPHOS 144 (H) 01/22/2018 0337   BILITOT 0.8 01/22/2018 0337   GFRNONAA >60 01/22/2018 0337   GFRAA >60 01/22/2018 0337   Lipase  No results found for: LIPASE  Studies/Results: Dg Pelvis Comp Min 3v  Result Date: 01/21/2018 CLINICAL DATA:  63 year old male with history of trauma from a fall off of a roof. Left-sided shoulder fracture. EXAM: JUDET PELVIS - 3+ VIEW COMPARISON:  01/17/2018. FINDINGS: Acute mildly comminuted mildly displaced fractures of the left inferior and superior pubic rami involving the parasymphyseal region. Nondisplaced incomplete fracture of the parasymphyseal region of the right superior pubic ramus. Known nondisplaced fracture through the left sacral ala is not well demonstrated on this plain film examination. Bilateral proximal femurs as  visualized appear intact, the femoral heads are located bilaterally. IMPRESSION: 1. Multiple pelvic fractures redemonstrated, as detailed above, better demonstrated on prior CT 01/17/2018. Electronically Signed   By: Trudie Reed M.D.   On: 01/21/2018 19:35   Dg Chest Port 1 View  Result Date: 01/21/2018 CLINICAL DATA:  Follow-up pneumothorax EXAM: PORTABLE CHEST 1 VIEW COMPARISON:  01/29/2018 FINDINGS: Cardiac shadow is mildly enlarged but stable. The lungs are well aerated bilaterally. No recurrent pneumothorax is noted on the left. Persistent  subcutaneous emphysema is seen. Minimal right basilar atelectasis is again noted. IMPRESSION: Stable appearance of the chest when compared with the prior study. Electronically Signed   By: Alcide Clever M.D.   On: 01/21/2018 07:26   Dg Chest Port 1 View  Result Date: 01/20/2018 CLINICAL DATA:  LEFT chest tube removal. Follow-up subcutaneous emphysema and RIGHT basal atelectasis versus pneumonia. EXAM: PORTABLE CHEST 1 VIEW 3:07 p.m.: COMPARISON:  Chest x-ray earlier same day 7:43 a.m. and previously. FINDINGS: Interval LEFT small bore chest tube removal. No evidence of pneumothorax. Patchy opacities at the RIGHT lung base, unchanged since earlier in the day. Lungs remain clear otherwise. Stable subcutaneous emphysema in the LEFT chest wall. Cardiac silhouette moderately enlarged, unchanged. Pulmonary vascularity normal. IMPRESSION: 1. No pneumothorax after LEFT chest tube removal. 2. Stable atelectasis and/or pneumonia at the RIGHT lung base since earlier in the day. 3. Stable subcutaneous emphysema in the LEFT chest wall. 4. Stable cardiomegaly without pulmonary edema. Electronically Signed   By: Hulan Saas M.D.   On: 01/20/2018 21:14      Jerre Simon , [facility] 01/22/2018, 9:02 AM  Pager: [phone] Mon-Wed, Friday 7:00am-4:30pm Thurs 7am-11:30am  Consults: [phone]

## 2018-01-22 NOTE — Op Note (Signed)
Orthopaedic Surgery Operative Note (CSN: [id number] ) Date of Surgery: 01/22/2018  Admit Date: 01/17/2018   Diagnoses: Pre-Op Diagnoses: Left LC2 pelvic ring fracture   Post-Op Diagnosis: Same  Procedures: 1. CPT 27216-Percutaneous fixation of posterior pelvis 2. CPT 27217-Percutaneous fixation of left sided parasymphyseal fracture   Surgeons : Primary: Roby LoftsHaddix, Rand Boller P, MD  Location:OR 7   Anesthesia:General   Antibiotics: Ancef 2g preop   Tourniquet time:None   Estimated Blood Loss:Minimal  Complications:None  Specimens:None   Implants: Implant Name Type Inv. Item Serial No. Manufacturer Lot No. LRB No. Used Action  SCREW CANN LOCK FT 7.3X160 - [id number] Screw SCREW CANN LOCK FT 7.3X160  [employer] [id number] Left 1 Implanted  WASHER FOR 5.0 SCREWS - [id number] Washer WASHER FOR 5.0 SCREWS  [employer]  Left 1 Implanted  6.5 Cannulated FullThread 125mm   [id number] Synthes  Left 1 Implanted    Indications for Surgery: 63 year old male who fell off a roof and sustained multiple injuries including rib fractures and a pneumothorax for which she had a chest tube placed.  He also had a nondisplaced acromion fracture and a LC 2 pelvic ring injury.  An attempt was made at nonoperative treatment with mobilization with therapy.  Unfortunately he continued to have significant pelvic pain which was limiting his ability to participate in therapy.  Repeat x-ray showed some motion of the left side of his pelvis.  As a result I felt that proceeding with stress under examination with percutaneous fixation would be appropriate.  Risks and benefits were discussed with the patient.  Risks include but not limited to bleeding, infection, malunion, nonunion, hardware failure, chronic pain, nerve and blood vessel injury, DVT, need for hardware removal, stiffness, posttraumatic arthritis, heterotopic ossification, even the loss of life.  He agreed to proceed with surgery and consent was  obtained.  Operative Findings: 1.  Significantly unstable left sided hemipelvis with lateral compression stress. 2.  Percutaneous fixation of posterior pelvic ring injury using Synthes 7.3 mm cannulated screw with transsacral transiliac at S1. 3.  Retrograde superior pubic rami screw using Synthes 6.5 mm cannulated screw.  Procedure: The patient was identified in the preoperative holding area. Consent was confirmed with the patient and their family and all questions were answered. The operative extremity was marked after confirmation with the patient. he was then brought back to the operating room by our anesthesia colleagues.  He was carefully transferred over to a radiolucent flat top table.  He was placed under general anesthetic.  Foley catheter was placed.  A bump was placed under his sacrum to elevate him off the table.  Fluoroscopic imaging was used to confirm the unstable nature of his injury.  He had nearly 2 cm of motion of his left sided hemipelvis with a lateral compression force. The pelvis was then prepped and draped in usual sterile fashion. A preoperative timeout was performed to verify the patient, the procedure, and the extremity. Preoperative antibiotics were dosed.   An AP, inlet and outlet view were obtained and I was able to visualize the corridors of the sacrum well.  Felt that a transsacral transiliac screw at S1 would be most appropriate to fix the posterior pelvic ring. A 2.710mm guidepin was placed percutaneously at an appropriate starting point on the inlet and outlet views. It was advanced about 1 cm into the bone and an 11 blade was used to cut down on the wire. A 4.295mm cannulated drill was used to oscillate in the lateral ilium and  swallow the 2.480mm guidepin. The cannulated drill was used to appropriately position the trajectory. Inlet and outlet views were used to confirm appropriate positioning of the drill bit in the safe zone of the sacrum.  I crossed the far sacral  foramen. The drill was then removed and a threaded 2.558mm guide pin was placed in the drill path.  The guidepin was then sent across the right-sided SI joint and into the right lateral ilium.  A fluoro shot was used to confirm the length of the guidepin.  The guidepin was measured and a 160mm, fully threaded 7.723mm cannulated screw with a washer was chosen. This was advanced across the sacrum and I was able to obtain excellent purchase.   A retrograde superior pubic rami screw was then placed.  A 2.0 mm guidepin was then positioned on an inlet and obturator outlet view.  It was oscillated into the bone about 1 cm.  11 blade was used to cut down over this guidewire.  A 4.5 mm cannulated drill bit was then used to oscillate the guidepin and entered the superior pubic ramus.  I then guided the drill all the way up to the left acetabulum.  I then removed the drill and placed a 2.8 mm threaded guidepin and malleted this into the lateral ilium.  I then measured the length of the guidepin and placed a 6.5 mm fully threaded 125 mm screw.  Excellent purchase was obtained.  Final fluoro images were obtained. A stress was performed and there was no motion of the pelvis. The incisions were irrigated and closed with 3-0 monocryl and dermabond. They were dressed with mepilex dressings. The patient was then awoken from anesthesia and transferred to his regular bed and taken to the PACU in stable condition.  Post Op Plan/Instructions: The patient will be touchdown weightbearing to left lower extremity.  He will receive postoperative Ancef.  We will continue to receive Lovenox for DVT prophylaxis.  I was present and performed the entire surgery.  Truitt MerleKevin Veronika Heard, MD [facility]

## 2018-01-22 NOTE — Progress Notes (Signed)
Report given to Short stay RN.  Pt was told earlier this am that he could have clear liquids until 1000, pt had a jello.  Dr. Bretta Bang OK'd pt to go ahead for OR at this time.

## 2018-01-22 NOTE — Progress Notes (Signed)
Inpatient Rehabilitation Admissions Coordinator  Noted plans for OTR today. I will follow up postoperatively with his therapy progress to assist with planning dispo needs.  Ottie Glazier, RN, MSN Rehab Admissions Coordinator [phone] 01/22/2018 1:39 PM

## 2018-01-22 NOTE — Anesthesia Preprocedure Evaluation (Signed)
Anesthesia Evaluation  Patient identified by MRN, date of birth, ID band Patient awake    Reviewed: Allergy & Precautions, H&P , NPO status , Patient's Chart, lab work & pertinent test results  Airway Mallampati: II   Neck ROM: full    Dental   Pulmonary COPD, Current Smoker,    breath sounds clear to auscultation       Cardiovascular hypertension, + CAD, + Past MI and + Cardiac Stents   Rhythm:regular Rate:Normal     Neuro/Psych    GI/Hepatic   Endo/Other    Renal/GU      Musculoskeletal  (+) Arthritis ,   Abdominal   Peds  Hematology   Anesthesia Other Findings   Reproductive/Obstetrics                             Anesthesia Physical Anesthesia Plan  ASA: III  Anesthesia Plan: General   Post-op Pain Management:    Induction: Intravenous  PONV Risk Score and Plan: 1 and Ondansetron, Dexamethasone, Midazolam and Treatment may vary due to age or medical condition  Airway Management Planned: Oral ETT  Additional Equipment:   Intra-op Plan:   Post-operative Plan: Extubation in OR  Informed Consent: I have reviewed the patients History and Physical, chart, labs and discussed the procedure including the risks, benefits and alternatives for the proposed anesthesia with the patient or authorized representative who has indicated his/her understanding and acceptance.       Plan Discussed with: CRNA, Anesthesiologist and Surgeon  Anesthesia Plan Comments:         Anesthesia Quick Evaluation

## 2018-01-22 NOTE — Anesthesia Postprocedure Evaluation (Signed)
Anesthesia Post Note  Patient: Bryce Moore  Procedure(s) Performed: ORIF PELVIC FRACTURE WITH PERCUTANEOUS SCREWS (Left Pelvis)     Patient location during evaluation: PACU Anesthesia Type: General Level of consciousness: sedated Pain management: pain level controlled Vital Signs Assessment: post-procedure vital signs reviewed and stable Respiratory status: spontaneous breathing and respiratory function stable Cardiovascular status: stable Postop Assessment: no apparent nausea or vomiting Anesthetic complications: no    Last Vitals:  Vitals:   01/22/18 1935 01/22/18 1950  BP: 137/79 135/70  Pulse: 68 66  Resp: 20 20  Temp: 36.7 C   SpO2: 92% 91%    Last Pain:  Vitals:   01/22/18 1935  TempSrc:   PainSc: 4                  Lorrene Graef DANIEL

## 2018-01-22 NOTE — Progress Notes (Signed)
Orthopaedic Trauma Progress Note  S: Continues to have a significant amount of pain in his pelvis.  He states that his pelvic pain is worse than his rib fractures.  He states that when he is not moving is not too bad but anytime he moves or mobilizes he has a 9 out of 10 pain.  He is asking for something to be done in regards to it.  O:  Vitals:   01/22/18 0142 01/22/18 0526  BP: 122/82 131/86  Pulse: 63 82  Resp: 18 (!) 22  Temp: 98 F (36.7 C) 98.9 F (37.2 C)  SpO2: 91% 90%    General: No acute distress Left lower extremity and pelvis revealed skin without lesions.  Significant tenderness palpation with a lateral compression of his left side of his pelvis.  He is neurovascular intact distally  Imaging: Postreduction x-rays show no significant movement.  There is still some internal rotation of the left side of the hemipelvis.  Labs:  Results for orders placed or performed during the hospital encounter of 01/17/18 (from the past 24 hour(s))  CBC     Status: Abnormal   Collection Time: 01/21/18  8:47 AM  Result Value Ref Range   WBC 21.8 (H) 4.0 - 10.5 K/uL   RBC 3.44 (L) 4.22 - 5.81 MIL/uL   Hemoglobin 10.9 (L) 13.0 - 17.0 g/dL   HCT 16.132.2 (L) 09.639.0 - 04.552.0 %   MCV 93.6 80.0 - 100.0 fL   MCH 31.7 26.0 - 34.0 pg   MCHC 33.9 30.0 - 36.0 g/dL   RDW 40.913.7 81.111.5 - 91.415.5 %   Platelets 352 150 - 400 K/uL   nRBC 0.0 0.0 - 0.2 %  CBC     Status: Abnormal   Collection Time: 01/22/18  3:37 AM  Result Value Ref Range   WBC 18.4 (H) 4.0 - 10.5 K/uL   RBC 3.30 (L) 4.22 - 5.81 MIL/uL   Hemoglobin 10.1 (L) 13.0 - 17.0 g/dL   HCT 78.230.2 (L) 95.639.0 - 21.352.0 %   MCV 91.5 80.0 - 100.0 fL   MCH 30.6 26.0 - 34.0 pg   MCHC 33.4 30.0 - 36.0 g/dL   RDW 08.613.4 57.811.5 - 46.915.5 %   Platelets 354 150 - 400 K/uL   nRBC 0.0 0.0 - 0.2 %  Comprehensive metabolic panel     Status: Abnormal   Collection Time: 01/22/18  3:37 AM  Result Value Ref Range   Sodium 135 135 - 145 mmol/L   Potassium 3.5 3.5 - 5.1 mmol/L    Chloride 103 98 - 111 mmol/L   CO2 25 22 - 32 mmol/L   Glucose, Bld 110 (H) 70 - 99 mg/dL   BUN 8 8 - 23 mg/dL   Creatinine, Ser 6.290.64 0.61 - 1.24 mg/dL   Calcium 8.2 (L) 8.9 - 10.3 mg/dL   Total Protein 5.7 (L) 6.5 - 8.1 g/dL   Albumin 2.4 (L) 3.5 - 5.0 g/dL   AST 20 15 - 41 U/L   ALT 28 0 - 44 U/L   Alkaline Phosphatase 144 (H) 38 - 126 U/L   Total Bilirubin 0.8 0.3 - 1.2 mg/dL   GFR calc non Af Amer >60 >60 mL/min   GFR calc Af Amer >60 >60 mL/min   Anion gap 7 5 - 15    Assessment: 63 year old male status post fall off ladder  Injuries: 1.  LC 2 pelvic ring injury attempted none surgical treatment unfortunately continues to have pain we will  attempt percutaneous fixation today in the operating room 2.  Left acromion fracture nondisplaced.  Continue nonweightbearing precautions to the left upper extremity.  Weightbearing: TDWB LLE  Insicional and dressing care: None  Orthopedic device(s): None  CV/Blood loss: Hemoglobin 10.1.  Hemodynamically stable.  Pain management: 1. Diluaidid 05.-1mg  q 3 hours PRN 2. Robaxin 750mg  TID 3. Oxycodone 10-15 mg q4hours 4. Tramadol 50 mg q 6 hours  VTE prophylaxis: Lovenox 40 mg daily  ID: None  Dispo: PT/OT eval, likely CIR  Follow - up plan: TBD   Roby LoftsKevin P. Ettel Albergo, MD [facility] [phone] (phone)

## 2018-01-22 NOTE — Progress Notes (Signed)
Physical Therapy Treatment Patient Details Name: Bryce Moore MRN: [id number] DOB: 1955-05-16 Today's Date: 01/22/2018    History of Present Illness Pt is a 63 yo male s/p fall from roof sustaining L shoulder fx, NWB in sling; L C2 pelvic fx TDWB; Left T1 TVP fx, but does not report back pain; and rib pain. Pt has no recollection of fall. PMHx: HTN    PT Comments    On entry pt standing up using urinal with weightbearing through L LE, PT attempted to assist in weightshift off L LE. Pt reports going for surgery within the next hour, but was willing to perform seated exercises. PT will follow back tomorrow to check on progress after surgery.    Follow Up Recommendations  CIR;Supervision/Assistance - 24 hour     Equipment Recommendations  Wheelchair (measurements PT)    Recommendations for Other Services Rehab consult     Precautions / Restrictions Precautions Precautions: Fall Restrictions Weight Bearing Restrictions: Yes LUE Weight Bearing: Non weight bearing LLE Weight Bearing: Touchdown weight bearing Other Position/Activity Restrictions: Not an easy position for pt to abide by    Mobility  Bed Mobility               General bed mobility comments: in recliner upon arrival  Transfers Overall transfer level: Needs assistance Equipment used: 1 person hand held assist   Sit to Stand: Max assist         General transfer comment: max A to stand to use urinal, despite vc and attempt to offweight L LE pt continues to be unable to maintain weightbearing precautions.        Balance Overall balance assessment: Needs assistance Sitting-balance support: Single extremity supported Sitting balance-Leahy Scale: Fair       Standing balance-Leahy Scale: Poor                              Cognition Arousal/Alertness: Awake/alert Behavior During Therapy: Restless Overall Cognitive Status: Impaired/Different from baseline Area of Impairment: Following  commands;Safety/judgement                       Following Commands: Follows one step commands inconsistently(continues to be impulsive and impatient with movement) Safety/Judgement: Decreased awareness of safety;Decreased awareness of deficits            Exercises General Exercises - Lower Extremity Ankle Circles/Pumps: AROM;Both;10 reps;Seated Quad Sets: AROM;Both;10 reps;Seated Long Arc Quad: AROM;Both;10 reps;Seated Heel Slides: AROM;Both;10 reps;Seated Hip ABduction/ADduction: AROM;Both;10 reps;Seated Hip Flexion/Marching: AROM;Right;10 reps    General Comments        Pertinent Vitals/Pain Pain Assessment: 0-10 Pain Score: 9  Pain Location: ribs and pelvis Pain Descriptors / Indicators: Crushing Pain Intervention(s): Limited activity within patient's tolerance;Monitored during session;Repositioned           PT Goals (current goals can now be found in the care plan section) Acute Rehab PT Goals Patient Stated Goal: to get back to work PT Goal Formulation: With patient Time For Goal Achievement: 02/01/18 Potential to Achieve Goals: Good    Frequency    Min 3X/week      PT Plan Current plan remains appropriate       AM-PAC PT "6 Clicks" Mobility   Outcome Measure  Help needed turning from your back to your side while in a flat bed without using bedrails?: A Lot Help needed moving from lying on your back to sitting on the side  of a flat bed without using bedrails?: A Lot Help needed moving to and from a bed to a chair (including a wheelchair)?: A Lot Help needed standing up from a chair using your arms (e.g., wheelchair or bedside chair)?: Total Help needed to walk in hospital room?: Total Help needed climbing 3-5 steps with a railing? : Total 6 Click Score: 9    End of Session Equipment Utilized During Treatment: Gait belt Activity Tolerance: Patient tolerated treatment well;Patient limited by pain Patient left: in chair;with call bell/phone  within reach;with chair alarm set;with family/visitor present Nurse Communication: Mobility status PT Visit Diagnosis: Unsteadiness on feet (R26.81);Other abnormalities of gait and mobility (R26.89);Muscle weakness (generalized) (M62.81);History of falling (Z91.81)     Time: 1350-1402 PT Time Calculation (min) (ACUTE ONLY): 12 min  Charges:  $Therapeutic Exercise: 8-22 mins                     Milo Schreier B. Beverely Risen PT, DPT Acute Rehabilitation Services Pager [phone] Office [phone]    Elon Alas Fleet 01/22/2018, 2:18 PM

## 2018-01-23 ENCOUNTER — Inpatient Hospital Stay (HOSPITAL_COMMUNITY): Payer: BLUE CROSS/BLUE SHIELD

## 2018-01-23 LAB — CBC
HCT: 30.5 % — ABNORMAL LOW (ref 39.0–52.0)
Hemoglobin: 10.1 g/dL — ABNORMAL LOW (ref 13.0–17.0)
MCH: 30.4 pg (ref 26.0–34.0)
MCHC: 33.1 g/dL (ref 30.0–36.0)
MCV: 91.9 fL (ref 80.0–100.0)
Platelets: 386 10*3/uL (ref 150–400)
RBC: 3.32 MIL/uL — ABNORMAL LOW (ref 4.22–5.81)
RDW: 13.4 % (ref 11.5–15.5)
WBC: 14.5 10*3/uL — ABNORMAL HIGH (ref 4.0–10.5)
nRBC: 0 % (ref 0.0–0.2)

## 2018-01-23 MED ORDER — LIDOCAINE HCL (PF) 1 % IJ SOLN: 0.0000 mL | Freq: Once | INTRAMUSCULAR | Status: DC | PRN

## 2018-01-23 MED ORDER — HYDROMORPHONE HCL 1 MG/ML IJ SOLN
0.5000 mg | INTRAMUSCULAR | Status: DC | PRN
  Administered 2018-01-23 – 2018-01-24 (×4): 1 mg via INTRAVENOUS
  Filled 2018-01-23 (×4): qty 1

## 2018-01-23 NOTE — Progress Notes (Signed)
OT Cancellation Note  Patient Details Name: Bryce Moore MRN: [id number] DOB: 11-22-1955   Cancelled Treatment:    Reason Eval/Treat Not Completed: Patient not medically ready(Pt had thoracentesis and chest tube placement this AM. )   Pt's PA would like pt to rest today. Therapy to follow-up tomorrow.  Revonda Standard Cecil Cranker) Glendell Docker OTR/L Acute Rehabilitation Services Pager: [phone] Office: [phone]   Sandrea Hughs 01/23/2018, 4:09 PM

## 2018-01-23 NOTE — Progress Notes (Signed)
   01/23/18 1500  Clinical Encounter Type  Visited With Patient and family together  Visit Type Follow-up;Spiritual support;Psychological support;Social support  Spiritual Encounters  Spiritual Needs Grief support;Emotional   Briefly followed-up w/ pt with family present, he was about to have tube inserted for collapsed lung.  Compassionate presence.    Margretta Sidle resident, [phone]

## 2018-01-23 NOTE — Progress Notes (Signed)
Orthopaedic Trauma Progress Note  S: Patient doing well this morning, has noticed significant improvement in pelvic pain since surgery. Has no other complaints.  O:  Vitals:   01/23/18 0632 01/23/18 0638  BP: 118/85   Pulse: 69   Resp: 20   Temp: 98.2 F (36.8 C)   SpO2: (!) 87% (!) 88%    General: Sitting up in bed. No acute distress Cardiac: Heart regular rate and rhythm Lungs: Clear in anterior lung fields bilaterally. Left lower extremity and pelvis: Dressing in place, remains clean, dry, intact. No swelling noted. Mild tenderness around the incision. No tenderness to the thigh, knee, or lower leg. Good ROM in the knee, ankle, and foot. He is neurovascular intact distally  Imaging: stable post op imaging  Labs:  Results for orders placed or performed during the hospital encounter of 01/17/18 (from the past 24 hour(s))  CBC     Status: Abnormal   Collection Time: 01/23/18  2:08 AM  Result Value Ref Range   WBC 14.5 (H) 4.0 - 10.5 K/uL   RBC 3.32 (L) 4.22 - 5.81 MIL/uL   Hemoglobin 10.1 (L) 13.0 - 17.0 g/dL   HCT 88.8 (L) 91.6 - 94.5 %   MCV 91.9 80.0 - 100.0 fL   MCH 30.4 26.0 - 34.0 pg   MCHC 33.1 30.0 - 36.0 g/dL   RDW 03.8 88.2 - 80.0 %   Platelets 386 150 - 400 K/uL   nRBC 0.0 0.0 - 0.2 %    Assessment: 63 year old male status post fall off ladder  Injuries: 1.  LC 2 pelvic ring injury s/p percutaneous fixation of posterior pelvis and left sided parasymphaseal fracture on 01/22/18 2.  Left acromion fracture nondisplaced.  Continue nonweightbearing precautions to the left upper extremity.  Weightbearing: TDWB LLE  Insicional and dressing care: Dressing clean, dry, intact. Will plan to change tomorrow and leave open to air  Orthopedic device(s): None  CV/Blood loss: Hemoglobin 10.1.  Hemodynamically stable.  Pain management: 1. Diluaidid 05.-1mg  q 3 hours PRN 2. Robaxin 750mg  TID 3. Oxycodone 10-15 mg q4hours 4. Tramadol 50 mg q 6 hours 5. Tylenol 1000 mg TID  scheduled  VTE prophylaxis: Lovenox 40 mg daily restarted today  ID: Ancef 2g postoperatively  Foley/Lines: Foley catheter d/c this AM. KVO IVFs  Dispo: PT/OT eval, likely CIR  Follow - up plan: TBD   Fredrick Geoghegan A. Ladonna Snide [facility] ?[phone]? (phone)

## 2018-01-23 NOTE — Progress Notes (Addendum)
PT Cancellation Note  Patient Details Name: Bryce Moore MRN: [id number] DOB: [birth year]   Cancelled Treatment:    Reason Eval/Treat Not Completed: (P) Medical issues which prohibited therapy Surgical PA requested cancellation of therapy today due to L pneumothorax.  PT will follow back tomorrow.  Adara Kittle B. Beverely Risen PT, DPT Acute Rehabilitation Services Pager [phone] Office [phone]    Elon Alas Fleet 01/23/2018, 10:45 AM

## 2018-01-23 NOTE — Progress Notes (Signed)
[facility] Progress Note  1 Day Post-Op  Subjective: CC-  States that his pelvis/LLE feels better since surgery. Continues to have left sided chest pain from rib fractures. Denies any SOB but required supplemental O2 over night due to O2 sats dropping into upper 80's. Continues to have a productive cough, but states that he feels more phlegm in his throat that he is unable to expel. Pulling 750 on IS. States that he has not yet eaten since surgery. Denies abdominal pain. Passing flatus. Last BM 2 days ago.  Objective: Vital signs in last 24 hours: Temp:  [97.7 F (36.5 C)-98.4 F (36.9 C)] 98.2 F (36.8 C) (01/15 6962) Pulse Rate:  [62-75] 69 (01/15 0632) Resp:  [16-20] 20 (01/15 9528) BP: (116-148)/(69-85) 118/85 (01/15 4132) SpO2:  [87 %-96 %] 88 % (01/15 4401) Last BM Date: 01/21/18  Intake/Output from previous day: 01/14 0701 - 01/15 0700 In: 2337 [P.O.:462; I.V.:1525; IV Piggyback:350] Out: 1680 [Urine:1660; Blood:20] Intake/Output this shift: Total I/O In: -  Out: 180 [Urine:180]  PE: Gen:  Alert, NAD, pleasant HEENT: EOM's intact, pupils equal and round Card:  RRR, 2+ DP pulses Pulm:  Diffuse rhonchi bilaterally, diminished breath sounds R base, no wheezing, effort normal on 2L [place] Abd: Soft, NT/ND, +BS, no HSM Ext:  Sling LUE, calves soft and nontender Psych: A&Ox3  Skin: no rashes noted, warm and dry  Lab Results:  Recent Labs    01/22/18 0337 01/23/18 0208  WBC 18.4* 14.5*  HGB 10.1* 10.1*  HCT 30.2* 30.5*  PLT 354 386   BMET Recent Labs    01/22/18 0337  NA 135  K 3.5  CL 103  CO2 25  GLUCOSE 110*  BUN 8  CREATININE 0.64  CALCIUM 8.2*   PT/INR No results for input(s): LABPROT, INR in the last 72 hours. CMP     Component Value Date/Time   NA 135 01/22/2018 0337   K 3.5 01/22/2018 0337   CL 103 01/22/2018 0337   CO2 25 01/22/2018 0337   GLUCOSE 110 (H) 01/22/2018 0337   BUN 8 01/22/2018 0337   CREATININE 0.64 01/22/2018  0337   CALCIUM 8.2 (L) 01/22/2018 0337   PROT 5.7 (L) 01/22/2018 0337   ALBUMIN 2.4 (L) 01/22/2018 0337   AST 20 01/22/2018 0337   ALT 28 01/22/2018 0337   ALKPHOS 144 (H) 01/22/2018 0337   BILITOT 0.8 01/22/2018 0337   GFRNONAA >60 01/22/2018 0337   GFRAA >60 01/22/2018 0337   Lipase  No results found for: LIPASE     Studies/Results: Dg Pelvis Comp Min 3v  Result Date: 01/22/2018 CLINICAL DATA:  Postoperative internal fixation of pelvic fractures. EXAM: JUDET PELVIS - 3+ VIEW COMPARISON:  01/22/2018 FINDINGS: There is a single screw fixating the bilateral sacroiliac joints from left to right. A single screw fixing the left superior pubic ramus inferior to superior. Surgical hardware appears intact. Fracture lines are demonstrated at the left superior and inferior pubic rami. Symphysis pubis appears well approximated and fractures appear in near anatomic position. No dislocation of the hips. IMPRESSION: Status post ORIF of bilateral sacroiliac joints and left superior pubic ramus fractures. Electronically Signed   By: Burman Nieves M.D.   On: 01/22/2018 21:53   Dg Pelvis Comp Min 3v  Result Date: 01/22/2018 CLINICAL DATA:  Multiple pelvic fractures on a recent pelvic CT following a fall from a roof. EXAM: DG C-ARM 61-120 MIN; JUDET PELVIS - 3+ VIEW COMPARISON:  Pelvic CT dated 01/17/2018 and  2 day pelvic radiographs dated 01/21/2018. FINDINGS: Twenty-two C-arm views of the pelvis are available for interpretation. The previously demonstrated multiple pelvic fractures are better visualized on the previous CT. The images demonstrate placement of a screw bridging the sacrum and both iliac bones. There is also a screw bridging the previously demonstrated left pubic body and superior pubic ramus fractures. No internal fixation of the left inferior pubic ramus fracture is seen. IMPRESSION: Hardware fixation of the previously demonstrated pelvic fractures, as described above. Electronically Signed    By: Beckie SaltsSteven  Reid M.D.   On: 01/22/2018 19:11   Dg Pelvis Comp Min 3v  Result Date: 01/21/2018 CLINICAL DATA:  63 year old male with history of trauma from a fall off of a roof. Left-sided shoulder fracture. EXAM: JUDET PELVIS - 3+ VIEW COMPARISON:  01/17/2018. FINDINGS: Acute mildly comminuted mildly displaced fractures of the left inferior and superior pubic rami involving the parasymphyseal region. Nondisplaced incomplete fracture of the parasymphyseal region of the right superior pubic ramus. Known nondisplaced fracture through the left sacral ala is not well demonstrated on this plain film examination. Bilateral proximal femurs as visualized appear intact, the femoral heads are located bilaterally. IMPRESSION: 1. Multiple pelvic fractures redemonstrated, as detailed above, better demonstrated on prior CT 01/17/2018. Electronically Signed   By: Trudie Reedaniel  Entrikin M.D.   On: 01/21/2018 19:35   Dg C-arm 1-60 Min  Result Date: 01/22/2018 CLINICAL DATA:  Multiple pelvic fractures on a recent pelvic CT following a fall from a roof. EXAM: DG C-ARM 61-120 MIN; JUDET PELVIS - 3+ VIEW COMPARISON:  Pelvic CT dated 01/17/2018 and 2 day pelvic radiographs dated 01/21/2018. FINDINGS: Twenty-two C-arm views of the pelvis are available for interpretation. The previously demonstrated multiple pelvic fractures are better visualized on the previous CT. The images demonstrate placement of a screw bridging the sacrum and both iliac bones. There is also a screw bridging the previously demonstrated left pubic body and superior pubic ramus fractures. No internal fixation of the left inferior pubic ramus fracture is seen. IMPRESSION: Hardware fixation of the previously demonstrated pelvic fractures, as described above. Electronically Signed   By: Beckie SaltsSteven  Reid M.D.   On: 01/22/2018 19:11    Anti-infectives: Anti-infectives (From admission, onward)   Start     Dose/Rate Route Frequency Ordered Stop   01/23/18 0200  ceFAZolin  (ANCEF) IVPB 2g/100 mL premix     2 g 200 mL/hr over 30 Minutes Intravenous Every 8 hours 01/22/18 2047 01/24/18 0559       Assessment/Plan Fall from roof Concussion- SLP  L T1 TVP fx- no back pain, PT/OT L rib fractures withHPTX- s/pCT removed 01/12, pain control, IS, pulm toilet LC2 pelvic fracture- s/p perc fixation 1/14 Dr. Jena GaussHaddix, TDWB LLE L acromion fx- per ortho, sling, NWB LUE Elevated LFT's- tbili normal, AST/ALTWNL, ALPtrending down Hyponatremia- 135 (1/14),resolved HTN-home meds Hx of CAD s/p stent x2  FEN: 2g Na diet, KVO IVF VTE: SCDs, lovenox ZO:XWRUE:ancef periop Follow up: Trauma, ortho Foley: out Pain: IV dilaudid breakthrough; oxy 10-15mg  q4hr; scheduled tylenol 1000mg  TID, robaxin 750mg  TID, tramadol 50mg  q6hr  Plan: Check chest xray. PT/OT/Speech. Work on pain control, decrease IV dilaudid and increase oral meds.   LOS: 6 days    Franne FortsBrooke A Meuth , [facility] 01/23/2018, 7:55 AM Pager: [phone] Mon 7:00 am -11:30 AM Tues-Fri 7:00 am-4:30 pm Sat-Sun 7:00 am-11:30 am

## 2018-01-23 NOTE — Procedures (Signed)
Chest Tube Insertion Procedure Note  Indications:  Clinically significant Pneumothorax  Pre-operative Diagnosis: Pneumothorax  Post-operative Diagnosis: Pneumothorax  Procedure Details  Informed consent was obtained for the procedure.  Risks of lung perforation, hemorrhage, arrhythmia, and adverse drug reaction were discussed.   After sterile skin prep, using standard technique, a 14 pigtail French tube was placed in the left lateral 4 rib space.  Findings: air  Estimated Blood Loss:  Minimal         Specimens:  None              Complications:  None; patient tolerated the procedure well.         Disposition: chest xray to confirm placement, floor         Condition: stable  Attending Attestation: I was present and scrubbed for the entire procedure.  Mattie Marlin, [facility] Surgery Pager [phone]

## 2018-01-24 ENCOUNTER — Inpatient Hospital Stay (HOSPITAL_COMMUNITY): Payer: BLUE CROSS/BLUE SHIELD

## 2018-01-24 ENCOUNTER — Encounter (HOSPITAL_COMMUNITY): Payer: Self-pay | Admitting: *Deleted

## 2018-01-24 ENCOUNTER — Encounter (HOSPITAL_COMMUNITY): Payer: Self-pay | Admitting: Student

## 2018-01-24 LAB — COMPREHENSIVE METABOLIC PANEL
ALT: 20 U/L (ref 0–44)
AST: 23 U/L (ref 15–41)
Albumin: 2.5 g/dL — ABNORMAL LOW (ref 3.5–5.0)
Alkaline Phosphatase: 135 U/L — ABNORMAL HIGH (ref 38–126)
Anion gap: 9 (ref 5–15)
BUN: 7 mg/dL — ABNORMAL LOW (ref 8–23)
CO2: 27 mmol/L (ref 22–32)
Calcium: 8.7 mg/dL — ABNORMAL LOW (ref 8.9–10.3)
Chloride: 101 mmol/L (ref 98–111)
Creatinine, Ser: 0.75 mg/dL (ref 0.61–1.24)
GFR calc Af Amer: >60 mL/min (ref >60)
GFR calc non Af Amer: >60 mL/min (ref >60)
Glucose, Bld: 125 mg/dL — ABNORMAL HIGH (ref 70–99)
Potassium: 3.2 mmol/L — ABNORMAL LOW (ref 3.5–5.1)
Sodium: 137 mmol/L (ref 135–145)
Total Bilirubin: 0.3 mg/dL (ref 0.3–1.2)
Total Protein: 6 g/dL — ABNORMAL LOW (ref 6.5–8.1)

## 2018-01-24 LAB — CBC
HCT: 32.3 % — ABNORMAL LOW (ref 39.0–52.0)
Hemoglobin: 10.7 g/dL — ABNORMAL LOW (ref 13.0–17.0)
MCH: 30.6 pg (ref 26.0–34.0)
MCHC: 33.1 g/dL (ref 30.0–36.0)
MCV: 92.3 fL (ref 80.0–100.0)
Platelets: 491 10*3/uL — ABNORMAL HIGH (ref 150–400)
RBC: 3.5 MIL/uL — ABNORMAL LOW (ref 4.22–5.81)
RDW: 13.4 % (ref 11.5–15.5)
WBC: 21 10*3/uL — ABNORMAL HIGH (ref 4.0–10.5)
nRBC: 0 % (ref 0.0–0.2)

## 2018-01-24 MED ORDER — TRAZODONE HCL 50 MG PO TABS
50.0000 mg | ORAL_TABLET | Freq: Every day | ORAL | Status: DC
  Administered 2018-01-24 – 2018-01-25 (×2): 50 mg via ORAL
  Filled 2018-01-24 (×2): qty 1

## 2018-01-24 MED ORDER — POTASSIUM CHLORIDE CRYS ER 20 MEQ PO TBCR
40.0000 meq | EXTENDED_RELEASE_TABLET | Freq: Two times a day (BID) | ORAL | Status: AC
  Administered 2018-01-24 – 2018-01-25 (×4): 40 meq via ORAL
  Filled 2018-01-24 (×4): qty 2

## 2018-01-24 MED ORDER — HYDROMORPHONE HCL 1 MG/ML IJ SOLN
0.5000 mg | INTRAMUSCULAR | Status: DC | PRN
  Filled 2018-01-24 (×2): qty 1

## 2018-01-24 NOTE — Progress Notes (Signed)
Physical Therapy Progress Note  Clinical Impression: Pt up in chair and in visibly less pain than prior to surgery. Pt continues to be impulsive with his movements and immediately got up from chair to show therapy staff how well he is doing. Pt not maintaining TTWB on L LE in the process. Pt educated on need for TTWB to protect his pelvis from further damage. After that pt compliant with TTWB for transfers to and from wheelchair requiring only min A. Pt is very much wanting to go home instead of to rehab and if family can build wheelchair ramp PT recommends d/c home with HHPT.     01/24/18 1747  PT Visit Information  Last PT Received On 01/24/18  Assistance Needed +2  History of Present Illness Pt is a 63 yo male s/p fall from roof sustaining L shoulder fx, NWB in sling; L C2 pelvic fx TDWB; Left T1 TVP fx, but does not report back pain; and rib pain. Pt has no recollection of fall. PMHx: HTN (sx 01/22/18 with TDWB on LLE due to hardware in L pelvis)  Subjective Data  Patient Stated Goal to get back to work  Precautions  Precautions Fall  Precaution Comments L chest tube; NWB LUE and TDWB LLE  Required Braces or Orthoses Sling  Restrictions  Weight Bearing Restrictions Yes  LUE Weight Bearing NWB  LLE Weight Bearing TWB  Pain Assessment  Pain Assessment Faces  Faces Pain Scale 6  Pain Location ribs and pelvis  Pain Descriptors / Indicators Crushing  Pain Intervention(s) Limited activity within patient's tolerance;Monitored during session;Repositioned  Cognition  Arousal/Alertness Awake/alert  Behavior During Therapy Restless  Overall Cognitive Status Impaired/Different from baseline  Area of Impairment Following commands;Safety/judgement  Following Commands Follows one step commands consistently  Safety/Judgement Decreased awareness of safety  General Comments required reminders for TDWB status even post sx.  Bed Mobility  General bed mobility comments in recliner upon arrival   Transfers  Overall transfer level Needs assistance  Equipment used 1 person hand held assist  Transfers Stand Pivot Transfers  Sit to Stand Min assist  Stand pivot transfers Min assist  Squat pivot transfers Min assist  General transfer comment requires cues to wait until OTR is ready for transfer  Wheelchair Mobility  Wheelchair mobility Yes  Wheelchair propulsion Right upper extremity;Right lower extremity  Wheelchair parts Supervision/cueing  Distance 15  Wheelchair Assistance Details (indicate cue type and reason) limited distance due to chest tube hooked to wall suction  Balance  Overall balance assessment Needs assistance  Sitting-balance support Single extremity supported  Sitting balance-Leahy Scale Fair  Standing balance-Leahy Scale Fair  General Comments  General comments (skin integrity, edema, etc.) chest tube hooked to suction   PT - End of Session  Equipment Utilized During Treatment Gait belt  Activity Tolerance Patient tolerated treatment well;Patient limited by pain  Patient left in chair;with call bell/phone within reach;with chair alarm set;with family/visitor present  Nurse Communication Mobility status   PT - Assessment/Plan  PT Plan Discharge plan needs to be updated  PT Visit Diagnosis Unsteadiness on feet (R26.81);Other abnormalities of gait and mobility (R26.89);Muscle weakness (generalized) (M62.81);History of falling (Z91.81)  PT Frequency (ACUTE ONLY) Min 3X/week  Follow Up Recommendations Home health PT;Supervision/Assistance - 24 hour  PT equipment Wheelchair (measurements PT);Wheelchair cushion (measurements PT)  AM-PAC PT "6 Clicks" Mobility Outcome Measure (Version 2)  Help needed turning from your back to your side while in a flat bed without using bedrails? 3  Help  needed moving from lying on your back to sitting on the side of a flat bed without using bedrails? 3  Help needed moving to and from a bed to a chair (including a wheelchair)? 3   Help needed standing up from a chair using your arms (e.g., wheelchair or bedside chair)? 3  Help needed to walk in hospital room? 1  Help needed climbing 3-5 steps with a railing?  1  6 Click Score 14  Consider Recommendation of Discharge To: CIR/SNF/LTACH  PT Goal Progression  Progress towards PT goals Progressing toward goals  Acute Rehab PT Goals  PT Goal Formulation With patient  Time For Goal Achievement 02/01/18  Potential to Achieve Goals Good  PT Time Calculation  PT Start Time (ACUTE ONLY) 1554  PT Stop Time (ACUTE ONLY) 1628  PT Time Calculation (min) (ACUTE ONLY) 34 min  PT General Charges  $$ ACUTE PT VISIT 1 Visit  PT Treatments  $Wheel Chair Management 8-22 mins   Bryce Moore B. Beverely Risen PT, DPT Acute Rehabilitation Services Pager [phone] Office [phone]

## 2018-01-24 NOTE — Progress Notes (Signed)
[facility] Progress Note  2 Days Post-Op  Subjective: CC-  States that he did not sleep very well last night due to pain from ribs/chest tube. Denies SOB. Cough improved. Pulling 750 on IS. Minimal pain from pelvis. States that pain control during the day was fairly adequate, but pain is worse at night. Anxious to work with therapies and get home.  Objective: Vital signs in last 24 hours: Temp:  [98.1 F (36.7 C)-98.6 F (37 C)] 98.1 F (36.7 C) (01/16 0135) Pulse Rate:  [60-73] 60 (01/16 0444) Resp:  [16-18] 18 (01/16 0444) BP: (122-142)/(72-89) 140/89 (01/16 0444) SpO2:  [89 %-100 %] 97 % (01/16 0444) Last BM Date: 01/22/18  Intake/Output from previous day: 01/15 0701 - 01/16 0700 In: 820 [P.O.:720; IV Piggyback:100] Out: 2246 [Urine:2230; Chest Tube:16] Intake/Output this shift: No intake/output data recorded.  PE: Gen:  Alert, NAD, pleasant HEENT: EOM's intact, pupils equal and round Card:  RRR, 2+ DP pulses Pulm:  few rhonchi bilaterally, no wheezing, effort normal on room air, CT without leak Abd: Soft, NT/ND, +BS, no HSM Ext:  Sling LUE, calves soft and nontender Psych: A&Ox3  Skin: no rashes noted, warm and dry  Lab Results:  Recent Labs    01/23/18 0208 01/24/18 0224  WBC 14.5* 21.0*  HGB 10.1* 10.7*  HCT 30.5* 32.3*  PLT 386 491*   BMET Recent Labs    01/22/18 0337 01/24/18 0224  NA 135 137  K 3.5 3.2*  CL 103 101  CO2 25 27  GLUCOSE 110* 125*  BUN 8 7*  CREATININE 0.64 0.75  CALCIUM 8.2* 8.7*   PT/INR No results for input(s): LABPROT, INR in the last 72 hours. CMP     Component Value Date/Time   NA 137 01/24/2018 0224   K 3.2 (L) 01/24/2018 0224   CL 101 01/24/2018 0224   CO2 27 01/24/2018 0224   GLUCOSE 125 (H) 01/24/2018 0224   BUN 7 (L) 01/24/2018 0224   CREATININE 0.75 01/24/2018 0224   CALCIUM 8.7 (L) 01/24/2018 0224   PROT 6.0 (L) 01/24/2018 0224   ALBUMIN 2.5 (L) 01/24/2018 0224   AST 23 01/24/2018 0224    ALT 20 01/24/2018 0224   ALKPHOS 135 (H) 01/24/2018 0224   BILITOT 0.3 01/24/2018 0224   GFRNONAA >60 01/24/2018 0224   GFRAA >60 01/24/2018 0224   Lipase  No results found for: LIPASE     Studies/Results: Ct Pelvis Wo Contrast  Result Date: 01/23/2018 CLINICAL DATA:  Pelvic fracture follow-up. EXAM: CT PELVIS WITHOUT CONTRAST TECHNIQUE: Multidetector CT imaging of the pelvis was performed following the standard protocol without intravenous contrast. COMPARISON:  Pelvic x-rays from yesterday. CT abdomen pelvis dated January 17, 2018. FINDINGS: Urinary Tract:  No abnormality visualized. Bowel:  Unremarkable visualized pelvic bowel loops. Vascular/Lymphatic: No pathologically enlarged lymph nodes. Aortoiliac atherosclerosis. Reproductive:  No mass or other significant abnormality. Other: Interval decrease in size of the anterior pelvic hematoma in the prevesical space, now measuring 2.5 x 3.8 cm, previously 3.5 x 7.4 cm. New small presacral hematoma. Musculoskeletal: Interval trans-sacral trans-iliac single screw fixation. The sacroiliac joints are maintained. Minimally displaced fracture of the left anterior sacral ala is unchanged. Unchanged nondisplaced fracture of the left posterior iliac wing. Interval single screw fixation of the left superior pubic ramus with unchanged mildly displaced left parasymphyseal fracture. The pubic symphysis appears intact. Unchanged minimally angulated left inferior pubic rami fractures. No fluid collection. Postsurgical subcutaneous emphysema in the proximal left gluteus medius muscle  and overlying subcutaneous tissues. IMPRESSION: 1. Interval bilateral sacroiliac joint and left superior pubic ramus ORIF. No pelvic diastasis. 2. Unchanged left anterior sacral ala, left posterior iliac wing, left parasymphyseal, and left inferior pubic rami fractures. 3. Decreased anterior pelvic hematoma in the prevesical space. 4. New small presacral hematoma. Electronically Signed    By: Obie Dredge M.D.   On: 01/23/2018 09:22   Dg Pelvis Comp Min 3v  Result Date: 01/22/2018 CLINICAL DATA:  Postoperative internal fixation of pelvic fractures. EXAM: JUDET PELVIS - 3+ VIEW COMPARISON:  01/22/2018 FINDINGS: There is a single screw fixating the bilateral sacroiliac joints from left to right. A single screw fixing the left superior pubic ramus inferior to superior. Surgical hardware appears intact. Fracture lines are demonstrated at the left superior and inferior pubic rami. Symphysis pubis appears well approximated and fractures appear in near anatomic position. No dislocation of the hips. IMPRESSION: Status post ORIF of bilateral sacroiliac joints and left superior pubic ramus fractures. Electronically Signed   By: Burman Nieves M.D.   On: 01/22/2018 21:53   Dg Pelvis Comp Min 3v  Result Date: 01/22/2018 CLINICAL DATA:  Multiple pelvic fractures on a recent pelvic CT following a fall from a roof. EXAM: DG C-ARM 61-120 MIN; JUDET PELVIS - 3+ VIEW COMPARISON:  Pelvic CT dated 01/17/2018 and 2 day pelvic radiographs dated 01/21/2018. FINDINGS: Twenty-two C-arm views of the pelvis are available for interpretation. The previously demonstrated multiple pelvic fractures are better visualized on the previous CT. The images demonstrate placement of a screw bridging the sacrum and both iliac bones. There is also a screw bridging the previously demonstrated left pubic body and superior pubic ramus fractures. No internal fixation of the left inferior pubic ramus fracture is seen. IMPRESSION: Hardware fixation of the previously demonstrated pelvic fractures, as described above. Electronically Signed   By: Beckie Salts M.D.   On: 01/22/2018 19:11   Dg Chest Port 1 View  Result Date: 01/24/2018 CLINICAL DATA:  Followup traumatic left pneumothorax and rib fractures. EXAM: PORTABLE CHEST 1 VIEW COMPARISON:  01/23/2018 FINDINGS: Stable mild cardiomegaly. Left pleural pigtail catheter remains in  place. No residual pneumothorax visualized. Multiple left rib fractures are again noted. Pulmonary airspace disease in the right upper lobe shows no significant change. IMPRESSION: 1. No residual pneumothorax visualized. Multiple left rib fractures again noted. 2. Stable right upper lobe airspace disease. Electronically Signed   By: Myles Rosenthal M.D.   On: 01/24/2018 07:35   Dg Chest Port 1 View  Result Date: 01/23/2018 CLINICAL DATA:  Patient suffered left rib fractures in a fall from a roof 01/17/2018. Left chest tube in place. EXAM: PORTABLE CHEST 1 VIEW COMPARISON:  Single view of the chest earlier today and 01/21/2018. FINDINGS: New pigtail catheter is in place in the left chest. Left pneumothorax seen on the examination earlier today has resolved. Subcutaneous emphysema is seen along the left chest wall. Hazy airspace opacity in the right upper lobe is again seen. Left rib fractures noted. IMPRESSION: Resolved left pneumothorax with the new chest tube in place. Hazy airspace disease in the right upper lobe as seen on the examination earlier today could be due to developing pneumonia. Left rib fractures as seen on prior studies. Electronically Signed   By: Drusilla Kanner M.D.   On: 01/23/2018 11:58   Dg Chest Port 1 View  Result Date: 01/23/2018 CLINICAL DATA:  SOB for a few minutes when he was not wearing his O2. No longer having sob  by time of cxr when he was wearing O2 canula. Denies any chest pains. Reports he fell from a height of 20 feet last Thursday. Broken ribs on left side. Reports thick chest congestion but cannot cough it up. Hx of x2 prior MI, htn controlled with medication, copd. Hx of coronary angioplasty with stent placement, chest tube insertion left 01/17/2018. 40 year smoker. EXAM: PORTABLE CHEST - 1 VIEW COMPARISON:  01/21/2018 and previous FINDINGS: Recurrent moderately large left pneumothorax of potentially greater than 50%. Stable left lateral subcutaneous emphysema. Coarse  interstitial opacities in the right upper lobe, increased since previous. Heart size and mediastinal contours are within normal limits. No effusion. Minimally displaced fracture of lateral aspect left fifth and sixth ribs. IMPRESSION: 1. Recurrent moderately large left pneumothorax. Critical Value/emergent results were called by telephone at the time of interpretation on 01/23/2018 at 9:52 am to Dr. Carlena Bjornstad , who verbally acknowledged these results. Electronically Signed   By: Corlis Leak M.D.   On: 01/23/2018 09:53   Dg C-arm 1-60 Min  Result Date: 01/22/2018 CLINICAL DATA:  Multiple pelvic fractures on a recent pelvic CT following a fall from a roof. EXAM: DG C-ARM 61-120 MIN; JUDET PELVIS - 3+ VIEW COMPARISON:  Pelvic CT dated 01/17/2018 and 2 day pelvic radiographs dated 01/21/2018. FINDINGS: Twenty-two C-arm views of the pelvis are available for interpretation. The previously demonstrated multiple pelvic fractures are better visualized on the previous CT. The images demonstrate placement of a screw bridging the sacrum and both iliac bones. There is also a screw bridging the previously demonstrated left pubic body and superior pubic ramus fractures. No internal fixation of the left inferior pubic ramus fracture is seen. IMPRESSION: Hardware fixation of the previously demonstrated pelvic fractures, as described above. Electronically Signed   By: Beckie Salts M.D.   On: 01/22/2018 19:11    Anti-infectives: Anti-infectives (From admission, onward)   Start     Dose/Rate Route Frequency Ordered Stop   01/23/18 0200  ceFAZolin (ANCEF) IVPB 2g/100 mL premix     2 g 200 mL/hr over 30 Minutes Intravenous Every 8 hours 01/22/18 2047 01/23/18 2219       Assessment/Plan Fall from roof Concussion- SLP  L T1 TVP fx- no back pain, PT/OT L rib fxs withHPTX- initialCT removed 01/12, replaced 1/15. CXR without PNX today, continue chest tube to -20 suction and repeat CXR in AM. Continue pain control,  IS, pulm toilet LC2 pelvic fracture- s/p perc fixation 1/14 Dr. Jena Gauss, TDWB LLE L acromion fx- per ortho, sling, NWB LUE Elevated LFT's- tbili normal, AST/ALTWNL, ALPtrending down Hyponatremia- 137,resolved Hypokalemia - K3.2, replace with oral potassium HTN-home meds Hx of CAD s/p stent x2  FEN: 2g Na diet, KVO IVF VTE: SCDs, lovenox LE:XNTZG periop. WBC up 21 but patient afebrile, may be 2/2 procedure yesterday, repeat CBC in AM Follow up: Trauma, ortho Foley: out Pain: IV dilaudid breakthrough; oxy 10-15mg  q4hr; scheduled tylenol 1000mg  TID, robaxin 750mg  TID, tramadol 50mg  q6hr  Plan: PT/OT. Continue weaning IV pain medication. Increase trazodone to home dose.   LOS: 7 days    Franne Forts , [facility] 01/24/2018, 7:43 AM Pager: [phone] Mon 7:00 am -11:30 AM Tues-Fri 7:00 am-4:30 pm Sat-Sun 7:00 am-11:30 am

## 2018-01-24 NOTE — Progress Notes (Signed)
Occupational Therapy Treatment Patient Details Name: Bryce InaDanny R Kunde MRN: [id number] DOB: 05-03-1955 Today's Date: 01/24/2018    History of present illness (P) Pt is a 63 yo male s/p fall from roof sustaining L shoulder fx, NWB in sling; L C2 pelvic fx TDWB; Left T1 TVP fx, but does not report back pain; and rib pain. Pt has no recollection of fall. PMHx: HTN(sx 01/22/18 with TDWB on LLE due to hardware in L pelvis)   OT comments  Pt is s/p new surgery to pelvis to allow for less pain and TDWB. Pt currently, continues to require verbal cues for safety in order to abide by TDWB, Pt stating "and now I can walk on it just fine." OTR and PT in room describing healing process in detail and importance of abiding by TDWB. Pt currently stand pivot transfer from drop ar, recliner to w/c and stood at sink for 1 min for light grooming in standing. Pt performing w/c mobility as much as extension with chest tube attachment would allow for. Pt restless about wanting to go home. Disposition changed to home with HHOT as pt is nearly Independent for transfers requiring cues for locking w/c. Pt continues to progress and would benefit from continue OT skilled services for ADL, mobility and safety. Pt's family looking into wanting to build a ramp.    Follow Up Recommendations  Home health OT;Supervision/Assistance - 24 hour    Equipment Recommendations  3 in 1 bedside commode;Wheelchair (measurements OT);Wheelchair cushion (measurements OT)(narrow adult w/c)    Recommendations for Other Services      Precautions / Restrictions Precautions Precautions: (P) Fall Precaution Comments: (P) L chest tube; NWB LUE and TDWB LLE Required Braces or Orthoses: (P) Sling Restrictions Weight Bearing Restrictions: (P) Yes LUE Weight Bearing: (P) Non weight bearing LLE Weight Bearing: (P) Touchdown weight bearing       Mobility Bed Mobility               General bed mobility comments: (P) in recliner upon  arrival  Transfers Overall transfer level: (P) Needs assistance Equipment used: (P) 1 person hand held assist Transfers: (P) Stand Pivot Transfers Sit to Stand: (P) Min assist Stand pivot transfers: (P) Min assist Squat pivot transfers: (P) Min assist     General transfer comment: (P) requires cues to wait until OTR is ready for transfer    Balance Overall balance assessment: (P) Needs assistance Sitting-balance support: (P) Single extremity supported Sitting balance-Leahy Scale: (P) Fair       Standing balance-Leahy Scale: (P) Fair                             ADL either performed or assessed with clinical judgement   ADL Overall ADL's : Needs assistance/impaired                     Lower Body Dressing: Moderate assistance;Cueing for safety;Cueing for sequencing;Sitting/lateral leans;Sit to/from stand               Functional mobility during ADLs: Minimal assistance;+2 for physical assistance;+2 for safety/equipment;Wheelchair General ADL Comments: Min to moDA for UB ADL; ModA LB ADL- ribs and chest severe pain     Vision   Vision Assessment?: No apparent visual deficits   Perception     Praxis      Cognition Arousal/Alertness: (P) Awake/alert Behavior During Therapy: (P) Restless Overall Cognitive Status: (P) Impaired/Different from baseline Area of Impairment: (  P) Following commands;Safety/judgement                       Following Commands: (P) Follows one step commands consistently Safety/Judgement: (P) Decreased awareness of safety     General Comments: (P) required reminders for TDWB status even post sx.        Exercises Exercises: (P) General Upper Extremity General Exercises - Upper Extremity Elbow Flexion: (P) AROM;5 reps;Left;Seated Elbow Extension: (P) AROM;Left;5 reps;Seated Wrist Flexion: (P) AROM;Left;Seated Wrist Extension: (P) AROM;5 reps;Seated Digit Composite Flexion: (P) AROM;5 reps;Seated Composite  Extension: (P) AROM;Left;Seated   Shoulder Instructions       General Comments chest tube    Pertinent Vitals/ Pain       Pain Assessment: Faces Faces Pain Scale: (P) Hurts little more Pain Location: (P) ribs and pelvis Pain Descriptors / Indicators: (P) Crushing Pain Intervention(s): Limited activity within patient's tolerance;Patient requesting pain meds-RN notified;Repositioned  Home Living                                          Prior Functioning/Environment              Frequency  Min 2X/week        Progress Toward Goals  OT Goals(current goals can now be found in the care plan section)  Progress towards OT goals: Progressing toward goals  Acute Rehab OT Goals Patient Stated Goal: (P) to get back to work OT Goal Formulation: With patient Time For Goal Achievement: 02/01/18 Potential to Achieve Goals: Good ADL Goals Pt Will Perform Grooming: with modified independence;sitting Pt Will Perform Upper Body Dressing: with supervision Pt Will Perform Lower Body Dressing: with min assist;bed level;sitting/lateral leans Pt Will Transfer to Toilet: with mod assist;with transfer board;bedside commode Pt/caregiver will Perform Home Exercise Program: Increased ROM;Both right and left upper extremity;With Supervision Additional ADL Goal #1: Pt will perform stand pivot or lateral slide transfers from one surface to another with ModA.  Plan Discharge plan needs to be updated    Co-evaluation      Reason for Co-Treatment: Complexity of the patient's impairments (multi-system involvement);For patient/therapist safety;To address functional/ADL transfers   OT goals addressed during session: ADL's and self-care;Strengthening/ROM      AM-PAC OT "6 Clicks" Daily Activity     Outcome Measure   Help from another person eating meals?: Moore Help from another person taking care of personal grooming?: A Little Help from another person toileting, which  includes using toliet, bedpan, or urinal?: A Lot Help from another person bathing (including washing, rinsing, drying)?: A Lot Help from another person to put on and taking off regular upper body clothing?: A Little Help from another person to put on and taking off regular lower body clothing?: A Lot 6 Click Score: 16    End of Session Equipment Utilized During Treatment: Gait belt;Other (comment)(w/c)  OT Visit Diagnosis: Unsteadiness on feet (R26.81);Muscle weakness (generalized) (M62.81) Pain - Right/Left: Left Pain - part of body: Shoulder;Leg   Activity Tolerance Patient tolerated treatment well;Patient limited by pain   Patient Left in chair;with call bell/phone within reach   Nurse Communication Mobility status;Weight bearing status        Time: 9381-8299 OT Time Calculation (min): 34 min  Charges: OT General Charges $OT Visit: 1 Visit OT Treatments $Self Care/Home Management : 8-22 mins  Revonda Standard Cecil Cranker) Glendell Docker OTR/L Acute Rehabilitation  Services Pager: [phone] Office: [phone]    Sandrea HughsLLYSON  JELENEK 01/24/2018, 5:49 PM

## 2018-01-24 NOTE — Progress Notes (Addendum)
Inpatient Rehabilitation Admissions Coordinator  I met with patient at bedside to explain to him that his [place] insurance is not in network with [facility]. He has Blue local silver with [facility]. He lives in [place]. In network benefits are with [facility] which includes [facility]. He has improved coverage with in network facilities. Postoperative therapies pending, but patient is hopeful not to need an inpt rehab admission for he prefers d/c home. If rehab needed, he prefers [facility] rehab facility. I will contact his wife today after she gets off from work about 1330 to explain to her their in network and out of network coverage. I have alerted RN CM and SW. We will sign off at this time.  Danne Baxter, RN, MSN Rehab Admissions Coordinator [phone] 01/24/2018 10:24 AM  I updated pt's wife by phone of their in network and out of network benefits. [place] is out of network. In network benefits are with [facility] which includes [facility].

## 2018-01-25 ENCOUNTER — Inpatient Hospital Stay (HOSPITAL_COMMUNITY): Payer: BLUE CROSS/BLUE SHIELD

## 2018-01-25 LAB — BASIC METABOLIC PANEL
Anion gap: 11 (ref 5–15)
BUN: 8 mg/dL (ref 8–23)
CO2: 24 mmol/L (ref 22–32)
Calcium: 8.9 mg/dL (ref 8.9–10.3)
Chloride: 101 mmol/L (ref 98–111)
Creatinine, Ser: 0.8 mg/dL (ref 0.61–1.24)
GFR calc Af Amer: >60 mL/min (ref >60)
GFR calc non Af Amer: >60 mL/min (ref >60)
Glucose, Bld: 113 mg/dL — ABNORMAL HIGH (ref 70–99)
Potassium: 3.8 mmol/L (ref 3.5–5.1)
Sodium: 136 mmol/L (ref 135–145)

## 2018-01-25 LAB — CBC
HCT: 32.6 % — ABNORMAL LOW (ref 39.0–52.0)
Hemoglobin: 11 g/dL — ABNORMAL LOW (ref 13.0–17.0)
MCH: 31.3 pg (ref 26.0–34.0)
MCHC: 33.7 g/dL (ref 30.0–36.0)
MCV: 92.6 fL (ref 80.0–100.0)
Platelets: 585 10*3/uL — ABNORMAL HIGH (ref 150–400)
RBC: 3.52 MIL/uL — ABNORMAL LOW (ref 4.22–5.81)
RDW: 13.4 % (ref 11.5–15.5)
WBC: 16 10*3/uL — ABNORMAL HIGH (ref 4.0–10.5)
nRBC: 0 % (ref 0.0–0.2)

## 2018-01-25 NOTE — Progress Notes (Signed)
Patient suffers from multiple left rib fractures and pelvic fracture which impairs their ability to perform daily activities like bathing, dressing, feeding, grooming and toileting in the home.  A cane, crutch or walker will not resolve  issue with performing activities of daily living. A wheelchair will allow patient to safely perform daily activities. Patient can safely propel the wheelchair in the home or has a caregiver who can provide assistance.  Accessories: elevating leg rests (ELRs), wheel locks, extensions and anti-tippers.  Franne Forts, [facility] 01/25/2018, 7:36 AM Pager: [phone] Mon 7:00 am -11:30 AM Tues-Fri 7:00 am-4:30 pm Sat-Sun 7:00 am-11:30 am

## 2018-01-25 NOTE — Discharge Summary (Signed)
[facility] Discharge Summary   Patient ID: Bryce InaDanny R Moore MRN: [id number] DOB/AGE: [birth year] 64 y.o.  Admit date: 01/17/2018 Discharge date: 01/26/2018  Admitting Diagnosis: Fall from roof Concussion L T1 TVP fx L rib fractures with HPTX LC2 pelvic fracture L shoulder fracture Hyperbilirubinemia  Hyponatremia HTN Hx of CAD s/p stent x2  Discharge Diagnosis Patient Active Problem List   Diagnosis Date Noted  . Pneumothorax, left   . Multiple trauma   . Generalized OA   . Chronic obstructive pulmonary disease (HCC)   . Coronary artery disease involving native coronary artery of native heart without angina pectoris   . Essential hypertension   . Pain   . Hyponatremia   . Leukocytosis   . Fall from roof 01/17/2018    Consultants Orthopedics  Imaging: Dg Chest Port 1 View  Result Date: 01/25/2018 CLINICAL DATA:  Follow-up chest tube EXAM: PORTABLE CHEST 1 VIEW COMPARISON:  01/24/2018 FINDINGS: Left-sided chest tube is noted and stable. No pneumothorax is identified. Stable right upper lobe airspace opacity is noted likely related to contusion. Rib fractures are again seen on the left. IMPRESSION: No pneumothorax identified. Patchy infiltrate in the right upper lobe stable from the previous exam. Electronically Signed   By: Alcide CleverMark  Lukens M.D.   On: 01/25/2018 07:34   Dg Chest Port 1 View  Result Date: 01/24/2018 CLINICAL DATA:  Followup traumatic left pneumothorax and rib fractures. EXAM: PORTABLE CHEST 1 VIEW COMPARISON:  01/23/2018 FINDINGS: Stable mild cardiomegaly. Left pleural pigtail catheter remains in place. No residual pneumothorax visualized. Multiple left rib fractures are again noted. Pulmonary airspace disease in the right upper lobe shows no significant change. IMPRESSION: 1. No residual pneumothorax visualized. Multiple left rib fractures again noted. 2. Stable right upper lobe airspace disease. Electronically Signed   By: Myles RosenthalJohn  Stahl M.D.   On:  01/24/2018 07:35   Dg Chest Port 1 View  Result Date: 01/23/2018 CLINICAL DATA:  Patient suffered left rib fractures in a fall from a roof 01/17/2018. Left chest tube in place. EXAM: PORTABLE CHEST 1 VIEW COMPARISON:  Single view of the chest earlier today and 01/21/2018. FINDINGS: New pigtail catheter is in place in the left chest. Left pneumothorax seen on the examination earlier today has resolved. Subcutaneous emphysema is seen along the left chest wall. Hazy airspace opacity in the right upper lobe is again seen. Left rib fractures noted. IMPRESSION: Resolved left pneumothorax with the new chest tube in place. Hazy airspace disease in the right upper lobe as seen on the examination earlier today could be due to developing pneumonia. Left rib fractures as seen on prior studies. Electronically Signed   By: Drusilla Kannerhomas  Dalessio M.D.   On: 01/23/2018 11:58    Procedures Dr. Jena GaussHaddix (01/22/18) -  1. CPT 27216-Percutaneous fixation of posterior pelvis 2. CPT 27217-Percutaneous fixation of left sided parasymphyseal fracture   Hospital Course:  Bryce Moore is a 63yo male PMH CAD s/p 2 cardiac stents and daily smoker, brought into [facility] by EMS as a level 2 trauma 1/9 after falling from a roof.  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 Patient reports he remembers falling and +LOC. Reports he remembers standing up and his feet starting to slide out from under him. Denies dizziness or chest pain prior to fall. He complains mostly of left chest pain on arrival and then found to have some left shoulder and pelvic  pain as well.  Workup showed a concussion, Left T1 transverse process fracture, Left rib fractures with hemopneumothorax, LC2 pelvic fracture, and left acromion fracture. Left chest tube placed in the ED for HPTX. Patient was admitted to the trauma service. Orthopedics was consulted and recommended conservative  treatment for LUE injury with NWB in sling. Attempted nonoperative management for his LC2 pelvic fracture, but the fracture was noted to move after ambulation therefore he was taken to the OR 1/14 for percutaneous fixation; advised TDWB LLE postoperatively. Pain control initially difficult but improved significantly after orthopedic surgery.  Serial chest xrays were followed after chest tube placement. Pneumothorax resolved and chest tube was removed 1/12. Unfortunately the patient became hypoxic and was found to have recurrence of his pneumothorax on 1/15 and a new chest tube was placed. Serial chest xrays were again followed and this chest tube was successfully removed on Saturday, January 26, 2018.  Patient worked with therapies during this admission who recommended home with home health therapies once medically stable for discharge. On Saturday, the patient was voiding well, tolerating diet, ambulating well, pain well controlled, vital signs stable, incisions c/d/i and felt stable for discharge home.  Patient will follow up as below and knows to call with questions or concerns.      Physical Exam: BS equal after chest tube removed   Allergies as of 01/26/2018   No Known Allergies     Medication List    TAKE these medications   amLODipine 10 MG tablet Commonly known as:  NORVASC Take 10 mg by mouth every morning.   atenolol 25 MG tablet Commonly known as:  TENORMIN Take 25 mg by mouth every morning. 0800   carisoprodol 350 MG tablet Commonly known as:  SOMA Take 350 mg by mouth 2 (two) times daily.   finasteride 5 MG tablet Commonly known as:  PROSCAR Take 5 mg by mouth daily.   hydrochlorothiazide 25 MG tablet Commonly known as:  HYDRODIURIL Take 25 mg by mouth every morning.   Oxycodone HCl 10 MG Tabs Take 0.5 tablets (5 mg total) by mouth every 6 (six) hours as needed for moderate pain or severe pain (10 mg for moderate; 15 mg for severe).   oxyCODONE 5 MG immediate  release tablet Commonly known as:  Oxy IR/ROXICODONE Take 1-2 tablets (5-10 mg total) by mouth every 6 (six) hours as needed for moderate pain, severe pain or breakthrough pain.   sertraline 25 MG tablet Commonly known as:  ZOLOFT Take 25 mg by mouth every morning.   tamsulosin 0.4 MG Caps capsule Commonly known as:  FLOMAX Take 0.4 mg by mouth at bedtime.   traMADol 50 MG tablet Commonly known as:  ULTRAM Take 1-2 tablets (50-100 mg total) by mouth every 6 (six) hours as needed.   traZODone 50 MG tablet Commonly known as:  DESYREL Take 50 mg by mouth at bedtime as needed for sleep.            Durable Medical Equipment  (From admission, onward)         Start     Ordered   01/25/18 0736  For home use only DME 3 n 1  Once     01/25/18 0738   01/25/18 0736  For home use only DME standard manual wheelchair with seat cushion  Once    Comments:  Patient suffers from multiple left rib fractures and pelvic fracture which impairs their ability to perform daily activities like bathing, dressing, feeding, grooming  and toileting in the home.  A cane, crutch or walker will not resolve  issue with performing activities of daily living. A wheelchair will allow patient to safely perform daily activities. Patient can safely propel the wheelchair in the home or has a caregiver who can provide assistance.  Accessories: elevating leg rests (ELRs), wheel locks, extensions and anti-tippers.   01/25/18 2951           Follow-up Information    Achreja, Youlanda Mighty, MD. Call.   Specialty:  Family Medicine Why:  call to arrange post-hospitalization follow up appointment Contact information: [address] [place] [address] [phone]        Roby Lofts, MD. Call.   Specialty:  Orthopedic Surgery Why:  call to arrange follow up regarding your recent orthopedic surgery Contact information: [address] [place] [address] [phone]        [facility]  [place]. Go on 02/05/2018.   Why:  Your appointment is 02/05/18 at 9:40AM. Please arrive 30 minutes prior to your appointment to check in and fill out paperwork. Bring photo ID and any insurance information. Contact information: [address] [place] [address] [phone]       [facility] Go on 02/04/2018.   Why:  You need to have a chest xray on 02/04/18 (the day before your trauma clinic appointment). You do not have to have an appointment. Arrive any time 8am-5pm. Contact information: [address] [place] [address] [phone]           Signed: Franne Forts, [facility] 01/25/2018, 10:19 AM Pager: [phone] Mon-Thurs 7:00 am-4:30 pm Fri 7:00 am -11:30 AM Sat-Sun 7:00 am-11:30 am

## 2018-01-25 NOTE — Discharge Instructions (Signed)
Pneumothorax °A pneumothorax is commonly called a collapsed lung. It is a condition in which air leaks from a lung and builds up between the thin layer of tissue that covers the lungs (visceral pleura) and the interior wall of the chest cavity (parietal pleura). The air gets trapped outside the lung, between the lung and the chest wall (pleural space). The air takes up space and prevents the lung from fully expanding. °This condition sometimes occurs suddenly with no apparent cause. The buildup of air may be small or large. A small pneumothorax may go away on its own. A large pneumothorax will require treatment and hospitalization. °What are the causes? °This condition may be caused by: °· Trauma and injury to the chest wall. °· Surgery and other medical procedures. °· A complication of an underlying lung problem, especially chronic obstructive pulmonary disease (COPD) or emphysema. °Sometimes the cause of this condition is not known. °What increases the risk? °You are more likely to develop this condition if: °· You have an underlying lung problem. °· You smoke. °· You are 20-40 years old, male, tall, and underweight. °· You have a personal or family history of pneumothorax. °· You have an eating disorder (anorexia nervosa). °This condition can also happen quickly, even in people with no history of lung problems. °What are the signs or symptoms? °Sometimes a pneumothorax will have no symptoms. When symptoms are present, they can include: °· Chest pain. °· Shortness of breath. °· Increased rate of breathing. °· Bluish color to your lips or skin (cyanosis). °How is this diagnosed? °This condition may be diagnosed by: °· A medical history and physical exam. °· A chest X-ray, chest CT scan, or ultrasound. °How is this treated? °Treatment depends on how severe your condition is. The goal of treatment is to remove the extra air and allow your lung to expand back to its normal size. °· For a small pneumothorax: °? No  treatment may be needed. °? Extra oxygen is sometimes used to make it go away more quickly. °· For a large pneumothorax or a pneumothorax that is causing symptoms, a procedure is done to drain the air from your lungs. To do this, a health care provider may use: °? A needle with a syringe. This is used to suck air from a pleural space where no additional leakage is taking place. °? A chest tube. This is used to suck air where there is ongoing leakage into the pleural space. The chest tube may need to remain in place for several days until the air leak has healed. °· In more severe cases, surgery may be needed to repair the damage that is causing the leak. °· If you have multiple pneumothorax episodes or have an air leak that will not heal, a procedure called a pleurodesis may be done. A medicine is placed in the pleural space to irritate the tissues around the lung so that the lung will stick to the chest wall, seal any leaks, and stop any buildup of air in that space. °If you have an underlying lung problem, severe symptoms, or a large pneumothorax you will usually need to stay in the hospital. °Follow these instructions at home: °Lifestyle °· Do not use any products that contain nicotine or tobacco, such as cigarettes and e-cigarettes. These are major risk factors in pneumothorax. If you need help quitting, ask your health care provider. °· Do not lift anything that is heavier than 10 lb (4.5 kg), or the limit that your health care   provider tells you, until he or she says that it is safe.  Avoid activities that take a lot of effort (strenuous) for as long as told by your health care provider.  Return to your normal activities as told by your health care provider. Ask your health care provider what activities are safe for you.  Do not fly in an airplane or scuba dive until your health care provider says it is okay. General instructions  Take over-the-counter and prescription medicines only as told by your  health care provider.  If a cough or pain makes it difficult for you to sleep at night, try sleeping in a semi-upright position in a recliner or by using 2 or 3 pillows.  If you had a chest tube and it was removed, ask your health care provider when you can remove the bandage (dressing). While the dressing is in place, do not allow it to get wet.  Keep all follow-up visits as told by your health care provider. This is important. Contact a health care provider if:  You cough up thick mucus (sputum) that is yellow or green in color.  You were treated with a chest tube, and you have redness, increasing pain, or discharge at the site where it was placed. Get help right away if:  You have increasing chest pain or shortness of breath.  You have a cough that will not go away.  You begin coughing up blood.  You have pain that is getting worse or is not controlled with medicines.  The site where your chest tube was located opens up.  You feel air coming out of the site where the chest tube was placed.  You have a fever or persistent symptoms for more than 2-3 days.  You have a fever and your symptoms suddenly get worse. These symptoms may represent a serious problem that is an emergency. Do not wait to see if the symptoms will go away. Get medical help right away. Call your local emergency services (911 in the [place] Do not drive yourself to the hospital. Summary  A pneumothorax, commonly called a collapsed lung, is a condition in which air leaks from a lung and gets trapped between the lung and the chest wall (pleural space).  The buildup of air may be small or large. A small pneumothorax may go away on its own. A large pneumothorax will require treatment and hospitalization.  Treatment for this condition depends on how severe the pneumothorax is. The goal of treatment is to remove the extra air and allow the lung to expand back to its normal size. This information is not intended to  replace advice given to you by your health care provider. Make sure you discuss any questions you have with your health care provider. Document Released: 12/26/2004 Document Revised: 12/04/2016 Document Reviewed: 12/04/2016 Elsevier Interactive Patient Education  2019 [employer] Inc[employer]   Pain Medicine Instructions You may need pain medicine after an injury or illness. Two common types of pain medicine are:  Opioid pain medicine. These may be called opioids.  Non-opioid pain medicine. This includes NSAIDs. It is important to follow your doctor's instructions when you are taking pain medicine. Doing this can keep yourself and others safe. How can pain medicine affect me? Pain medicine may not make all of your pain go away. It should make you comfortable enough to:  Move.  Breathe.  Do normal activities. Opioids can cause side effects, such as:  Trouble pooping (constipation).  Feeling sick to  your stomach (nausea).  Throwing up (vomiting).  Feeling very sleepy.  Confusion.  Taking the medicine for nonmedical reasons even though taking it hurts your health and well-being (opioid use disorder).  Trouble breathing (respiratory depression). Taking opioids for longer than 3 days raises your risk of these side effects. Taking opioids for a long time can affect how well you can do daily tasks. Taking them for a long time also puts you at risk for:  Car crashes.  Depression.  Suicide.  Heart attack.  Taking too much of the medicine (overdose). This can lead to death. What should I do to stay safe while taking pain medicine? Take your medicine as told  Take pain medicine exactly as told by your doctor. Take it only when you need it.  Write down the times when you take your pain medicine. Look at the times before you take your next dose.  Take other over-the-counter or prescription medicines only as told by your doctor. ? If your pain medicine has acetaminophen in it, do not  take any other acetaminophen while you are taking this medicine. Too much can damage the liver.  Get pain medicine prescriptions from only one doctor. Avoid certain activities While you are taking prescription pain medicine, and for 8 hours after your last dose:  Do not drive.  Do not use machinery.  Do not use power tools.  Do not sign legal documents.  Do not drink alcohol.  Do not take sleeping pills.  Do not take care of children by yourself.  Do not do any activities that involve climbing or being in high places.  Do not go into any body of water unless there is an adult nearby who can watch you and help you if needed. This includes: ? Lakes. ? Rivers. ? Oceans. ? Spas. ? Swimming pools.  Keep others safe  Store your medicine as told by your doctor. Keep it where children and pets cannot reach it.  Do not share your pain medicine with anyone.  Do not save any leftover pills. If you have leftover pills, you can: ? Bring them to a take-back program. ? Bring them to a pharmacy that has a drug disposal container. ? Throw them in the trash. Check the medicine label or package insert to see if it is safe to throw it out. If it is safe, take the medicine out of the container. Mix it with something that makes it unusable, such as pet waste. Then put the medicine in the trash. General instructions  Talk with your doctor about other ways to manage your pain.  If you have trouble pooping: ? Drink enough fluid to keep your pee (urine) pale yellow. ? Use a poop (stool) softener as told by your doctor. ? Eat more fruits and vegetables.  Keep all follow-up visits as told by your doctor. This is important. Contact a doctor if:  Your medicine is not helping with your pain.  You have a rash.  You feel depressed. Get help right away if: Seek medical care right away if you are taking pain medicines and you (or people close to you) notice any of the following:  Trouble  breathing.  Breathing that is shorter than normal.  Breathing that is more shallow than normal.  Confusion.  Sleepiness.  Trouble staying awake.  Feeling sick to your stomach.  Throwing up.  Your skin or lips turning pale or bluish in color.  Tongue swelling. If you ever feel like you may  hurt yourself or others, or have thoughts about taking your own life, get help right away. Go to your nearest emergency department or call:  Your local emergency services (911 in the [place]  A suicide crisis helpline, such as the National Suicide Prevention Lifeline at (867)100-2076. This is open 24 hours a day. Summary  Take your pain medicine exactly as told by your doctor.  Pain medicine can help lower your pain. It may also cause side effects.  Talk with your doctor about other ways to manage your pain.  Follow your doctor's instructions about how to take your pain medicine and keep others safe. Ask what activities you should avoid while taking pain medicine. This information is not intended to replace advice given to you by your health care provider. Make sure you discuss any questions you have with your health care provider. Document Released: 06/14/2007 Document Revised: 08/07/2016 Document Reviewed: 08/07/2016 Elsevier Interactive Patient Education  2019 [employer].

## 2018-01-25 NOTE — Care Management Note (Signed)
Case Management Note  Patient Details  Name: ELBIE WICHERS MRN: [id number] Date of Birth: 04-Oct-1955  Subjective/Objective:   Pt admitted on 01/17/18 after falling from roof, sustaining Lt shoulder fx, Lt C2 fx, and Lt T1 TVP  fx.  PTA, pt independent, lives at home with spouse.                 Action/Plan: PT/OT recommending HH follow up, and pt agreeable to [facility] services.  Referral to [facility] for DME needs.  Referral to , per pt choice.  Start of care 24-48h post dc date.  Possible dc on 01/26/18, pending CT removal.  Expected Discharge Date:     01/26/2018            Expected Discharge Plan:  Home w Home Health Services  In-House Referral:     Discharge planning Services  CM Consult  Post Acute Care Choice:  Home Health Choice offered to:  Patient  DME Arranged:  3-N-1, Wheelchair manual DME Agency:  [facility]  HH Arranged:  PT, OT [facility] Agency:   [facility]  Status of Service:  In process, will continue to follow  If discussed at Long Length of Stay Meetings, dates discussed:    Additional Comments:  Glennon Mac, RN 01/25/2018, 5:08 PM

## 2018-01-25 NOTE — Progress Notes (Signed)
    Durable Medical Equipment  (From admission, onward)         Start     Ordered   01/25/18 0736  For home use only DME 3 n 1  Once     01/25/18 0738   01/25/18 0736  For home use only DME standard manual wheelchair with seat cushion  Once    Comments:  Patient suffers from multiple left rib fractures and pelvic fracture which impairs their ability to perform daily activities like bathing, dressing, feeding, grooming and toileting in the home.  A cane, crutch or walker will not resolve  issue with performing activities of daily living. A wheelchair will allow patient to safely perform daily activities. Patient can safely propel the wheelchair in the home or has a caregiver who can provide assistance.  Accessories: elevating leg rests (ELRs), wheel locks, extensions and anti-tippers.   01/25/18 0738         Sheran Lawless, PT Acute Rehabilitation Services [phone] 01/25/2018

## 2018-01-25 NOTE — Progress Notes (Signed)
Physical Therapy Treatment Patient Details Name: Bryce Moore MRN: [id number] DOB: [birth year] Today's Date: 01/25/2018    History of Present Illness Pt is a 63 yo male s/p fall from roof sustaining L shoulder fx, NWB in sling; L C2 pelvic fx TDWB; Left T1 TVP fx, but does not report back pain; and rib pain. S/p pelvic fixation 01/22/18.  Pt has no recollection of fall. PMHx: HTN    PT Comments    Patient progressing with w/c mobility and sit to stand without physical help, but cues needed for safety and to ensure limiting weight on L LE appropriately.  Educated in w/c parts removal and in [facility] if desired for renting temporary ramp for home.  PT to follow up if not d/c.    Follow Up Recommendations  Home health PT;Supervision/Assistance - 24 hour     Equipment Recommendations  Wheelchair (measurements PT);Wheelchair cushion (measurements PT);3in1 (PT)(16x16 w/ELR's)    Recommendations for Other Services       Precautions / Restrictions Precautions Precautions: Fall Precaution Comments: L chest tube; NWB LUE and TDWB LLE Required Braces or Orthoses: Sling Restrictions LUE Weight Bearing: Non weight bearing LLE Weight Bearing: Touchdown weight bearing    Mobility  Bed Mobility Overal bed mobility: Needs Assistance Bed Mobility: Sit to Supine       Sit to supine: HOB elevated;Mod assist   General bed mobility comments: assist for both legs into bed due to painful for him to perform unaided  Transfers Overall transfer level: Needs assistance Equipment used: None Transfers: Sit to/from [employer] Transfers Sit to Stand: Supervision Stand pivot transfers: Supervision;Min guard       General transfer comment: able to pivot to and from w/c with assist/demonstration for w/c set up with pt and family and assist for safety as initially standing up prior to environmental set up for safety  Ambulation/Gait                 Immunologist propulsion: Right upper extremity;Right lower extremity Wheelchair parts: Supervision/cueing Distance: 100 Wheelchair Assistance Details (indicate cue type and reason): assist to hold chest tube  Modified Rankin (Stroke Patients Only)       Balance Overall balance assessment: Needs assistance Sitting-balance support: Single extremity supported Sitting balance-Leahy Scale: Good     Standing balance support: Single extremity supported Standing balance-Leahy Scale: Poor Standing balance comment: needs at least R UE support for TDWB L LE                            Cognition Arousal/Alertness: Awake/alert Behavior During Therapy: Impulsive Overall Cognitive Status: Impaired/Different from baseline Area of Impairment: Following commands;Safety/judgement                   Current Attention Level: Selective     Safety/Judgement: Decreased awareness of safety            Exercises Other Exercises Other Exercises: showed me his hand and elbow ROM on L UE once in supine and sling off Other Exercises: Educated on ankle pumps and pt demonstrated    General Comments General comments (skin integrity, edema, etc.): wife in room and discussed equipment needs including 3:1, wheelchair (16x16 w/ELR's), and showed website for [facility] in case easier to get than building a ramp      Pertinent Vitals/Pain Pain Score: 7  Pain Location: ribs and pelvis Pain Descriptors / Indicators: Guarding Pain Intervention(s): Monitored during session;Repositioned    Home Living                      Prior Function            PT Goals (current goals can now be found in the care plan section) Progress towards PT goals: Progressing toward goals    Frequency    Min 5X/week      PT Plan Current plan remains appropriate;Frequency needs to be updated    Co-evaluation              AM-PAC PT "6 Clicks" Mobility    Outcome Measure  Help needed turning from your back to your side while in a flat bed without using bedrails?: A Little Help needed moving from lying on your back to sitting on the side of a flat bed without using bedrails?: A Little Help needed moving to and from a bed to a chair (including a wheelchair)?: A Little Help needed standing up from a chair using your arms (e.g., wheelchair or bedside chair)?: A Little Help needed to walk in hospital room?: Total Help needed climbing 3-5 steps with a railing? : Total 6 Click Score: 14    End of Session   Activity Tolerance: Patient tolerated treatment well Patient left: in bed;with call bell/phone within reach;with family/visitor present   PT Visit Diagnosis: Unsteadiness on feet (R26.81);Other abnormalities of gait and mobility (R26.89);Muscle weakness (generalized) (M62.81);History of falling (Z91.81)     Time: [id number] PT Time Calculation (min) (ACUTE ONLY): 19 min  Charges:  $Therapeutic Activity: 8-22 mins                     Sheran LawlessCyndi Terrelle Ruffolo, [place]PT Acute Rehabilitation Services [phone] 01/25/2018    Elray Mcgregorynthia Ryan Palermo 01/25/2018, 4:54 PM

## 2018-01-25 NOTE — Progress Notes (Signed)
Occupational Therapy Treatment Patient Details Name: Bryce Moore MRN: [id number] DOB: February 18, 1955 Today's Date: 01/25/2018    History of present illness Pt is a 63 yo male s/p fall from roof sustaining L shoulder fx, NWB in sling; L C2 pelvic fx TDWB; Left T1 TVP fx, but does not report back pain; and rib pain. Pt has no recollection of fall. PMHx: HTN(Pelvic surgery performed 01/22/18 and pt TTWB on LLE.)   OT comments  Pt seen for OT progress session addressing stand pivot transfers from recliner <-> BSC with minguardA and pt abiding mostly by TDWB on LLE and NWB LUE.LUEadjusted in sling and LUE there ex performed to gentle elbow, wrist and hand. Pt performing x5 chair push-ups on R side. Pain reported in ribs and next pain meds due at 10am. Pt performing lB dressing with Mod I using LB AE provided after demo with good carry over skills. Pt tolerating session well. Pt will be more independent and encouraged to buy hip kit to ensure safety and less caregiver burden. Pt continues to progress and would benefit from [facility] once cleared for D/C.    Follow Up Recommendations  Home health OT;Supervision/Assistance - 24 hour    Equipment Recommendations  3 in 1 bedside commode;Wheelchair (measurements OT);Wheelchair cushion (measurements OT)(narrow adult w/c with extension brake accessory and leg rest)    Recommendations for Other Services      Precautions / Restrictions Precautions Precautions: Fall Precaution Comments: L chest tube; NWB LUE and TDWB LLE Required Braces or Orthoses: Sling Restrictions Weight Bearing Restrictions: Yes LUE Weight Bearing: Non weight bearing LLE Weight Bearing: Touchdown weight bearing       Mobility Bed Mobility Overal bed mobility: Needs Assistance             General bed mobility comments: in recliner upon arrival  Transfers Overall transfer level: Needs assistance Equipment used: 1 person hand held assist Transfers: Stand Pivot Transfers Sit  to Stand: Min guard Stand pivot transfers: Min guard       General transfer comment: calmer today and not as anxious to get started before OTR was ready    Balance Overall balance assessment: Needs assistance   Sitting balance-Leahy Scale: Good       Standing balance-Leahy Scale: Fair                             ADL either performed or assessed with clinical judgement   ADL Overall ADL's : Needs assistance/impaired Eating/Feeding: Set up                       Toilet Transfer: Stand-pivot;BSC           Functional mobility during ADLs: Min guard;Wheelchair General ADL Comments: MinA for UB ADL; ModA LB ADL- ribs and chest pain     Vision   Vision Assessment?: No apparent visual deficits   Perception     Praxis      Cognition Arousal/Alertness: Awake/alert Behavior During Therapy: Restless Overall Cognitive Status: Within Functional Limits for tasks assessed                         Following Commands: Follows one step commands consistently       General Comments: pt able to abide by TDWB for stand pivot transfers from recliner to [facility]        Exercises Exercises: General Upper Extremity General Exercises -  Upper Extremity Elbow Flexion: AROM;5 reps;Left;Seated Elbow Extension: AROM;Left;5 reps;Seated Wrist Flexion: AROM;Left;Seated Wrist Extension: AROM;5 reps;Seated Digit Composite Flexion: AROM;5 reps;Seated Composite Extension: AROM;Left;Seated Chair Push Up: AROM;Right;5 reps;Seated   Shoulder Instructions       General Comments      Pertinent Vitals/ Pain       Pain Assessment: 0-10 Pain Score: 4  Pain Location: ribs and pelvis Pain Descriptors / Indicators: Crushing Pain Intervention(s): Limited activity within patient's tolerance  Home Living                                          Prior Functioning/Environment              Frequency  Min 2X/week        Progress Toward  Goals  OT Goals(current goals can now be found in the care plan section)  Progress towards OT goals: Progressing toward goals  Acute Rehab OT Goals Patient Stated Goal: to get back to work OT Goal Formulation: With patient Time For Goal Achievement: 02/01/18 Potential to Achieve Goals: Good ADL Goals Pt Will Perform Grooming: with modified independence;sitting Pt Will Perform Upper Body Dressing: with supervision Pt Will Perform Lower Body Dressing: with min assist;bed level;sitting/lateral leans Pt Will Transfer to Toilet: with mod assist;with transfer board;bedside commode Pt/caregiver will Perform Home Exercise Program: Increased ROM;Both right and left upper extremity;With Supervision Additional ADL Goal #1: Pt will perform stand pivot or lateral slide transfers from one surface to another with [place].  Plan Discharge plan remains appropriate    Co-evaluation                 AM-PAC OT "6 Clicks" Daily Activity     Outcome Measure   Help from another person eating meals?: None Help from another person taking care of personal grooming?: A Little Help from another person toileting, which includes using toliet, bedpan, or urinal?: A Lot Help from another person bathing (including washing, rinsing, drying)?: A Little Help from another person to put on and taking off regular upper body clothing?: A Little Help from another person to put on and taking off regular lower body clothing?: A Lot 6 Click Score: 17    End of Session Equipment Utilized During Treatment: Gait belt;Other (comment)(BSC)  OT Visit Diagnosis: Unsteadiness on feet (R26.81);Muscle weakness (generalized) (M62.81) Pain - Right/Left: Left Pain - part of body: Shoulder;Leg   Activity Tolerance Patient tolerated treatment well   Patient Left in chair;with call bell/phone within reach   Nurse Communication Mobility status;Weight bearing status        Time: 4128-7867 OT Time Calculation (min): 37  min  Charges: OT General Charges $OT Visit: 1 Visit OT Treatments $Self Care/Home Management : 23-37 mins  Ebony Hail Harold Hedge) Marsa Aris OTR/L Acute Rehabilitation Services Pager: [phone] Office: [phone]   Fredda Hammed 01/25/2018, 9:39 AM

## 2018-01-25 NOTE — Progress Notes (Signed)
[facility] Progress Note  3 Days Post-Op  Subjective: CC-  Sitting up in bed. Overall feeling well. Asking when he can go home. Worked well with therapies yesterday, recommending home health.  Pain well controlled on oral meds, he only took 1 dose of IV dilaudid yesterday. Denies abdominal pain. Tolerating diet. BM yesterday. Denies CP, SOB, or cough. Pulling 1000 on IS.   Objective: Vital signs in last 24 hours: Temp:  [98.2 F (36.8 C)-98.6 F (37 C)] 98.6 F (37 C) (01/17 0553) Pulse Rate:  [63-70] 70 (01/17 0553) Resp:  [16] 16 (01/17 0553) BP: (102-152)/(68-90) 140/90 (01/17 0553) SpO2:  [93 %-98 %] 98 % (01/17 0553) Last BM Date: 01/24/18  Intake/Output from previous day: 01/16 0701 - 01/17 0700 In: 240 [P.O.:240] Out: 2515 [Urine:2515] Intake/Output this shift: No intake/output data recorded.  PE: Gen: Alert, NAD, pleasant HEENT: EOM's intact, pupils equal and round Card: RRR, 2+ DP pulses Pulm:CTAB, no rhonchi or wheezing, effort normal on room air, CT without leak, pulling 1000 on IS Abd: Soft, NT/ND, +BS, no HSM ZOX:WRUEAExt:Sling LUE, calves soft and nontender Psych: A&Ox3  Skin: no rashes noted, warm and dry  Lab Results:  Recent Labs    01/24/18 0224 01/25/18 0609  WBC 21.0* 16.0*  HGB 10.7* 11.0*  HCT 32.3* 32.6*  PLT 491* 585*   BMET Recent Labs    01/24/18 0224 01/25/18 0609  NA 137 136  K 3.2* 3.8  CL 101 101  CO2 27 24  GLUCOSE 125* 113*  BUN 7* 8  CREATININE 0.75 0.80  CALCIUM 8.7* 8.9   PT/INR No results for input(s): LABPROT, INR in the last 72 hours. CMP     Component Value Date/Time   NA 136 01/25/2018 0609   K 3.8 01/25/2018 0609   CL 101 01/25/2018 0609   CO2 24 01/25/2018 0609   GLUCOSE 113 (H) 01/25/2018 0609   BUN 8 01/25/2018 0609   CREATININE 0.80 01/25/2018 0609   CALCIUM 8.9 01/25/2018 0609   PROT 6.0 (L) 01/24/2018 0224   ALBUMIN 2.5 (L) 01/24/2018 0224   AST 23 01/24/2018 0224   ALT 20  01/24/2018 0224   ALKPHOS 135 (H) 01/24/2018 0224   BILITOT 0.3 01/24/2018 0224   GFRNONAA >60 01/25/2018 0609   GFRAA >60 01/25/2018 0609   Lipase  No results found for: LIPASE     Studies/Results: Ct Pelvis Wo Contrast  Result Date: 01/23/2018 CLINICAL DATA:  Pelvic fracture follow-up. EXAM: CT PELVIS WITHOUT CONTRAST TECHNIQUE: Multidetector CT imaging of the pelvis was performed following the standard protocol without intravenous contrast. COMPARISON:  Pelvic x-rays from yesterday. CT abdomen pelvis dated January 17, 2018. FINDINGS: Urinary Tract:  No abnormality visualized. Bowel:  Unremarkable visualized pelvic bowel loops. Vascular/Lymphatic: No pathologically enlarged lymph nodes. Aortoiliac atherosclerosis. Reproductive:  No mass or other significant abnormality. Other: Interval decrease in size of the anterior pelvic hematoma in the prevesical space, now measuring 2.5 x 3.8 cm, previously 3.5 x 7.4 cm. New small presacral hematoma. Musculoskeletal: Interval trans-sacral trans-iliac single screw fixation. The sacroiliac joints are maintained. Minimally displaced fracture of the left anterior sacral ala is unchanged. Unchanged nondisplaced fracture of the left posterior iliac wing. Interval single screw fixation of the left superior pubic ramus with unchanged mildly displaced left parasymphyseal fracture. The pubic symphysis appears intact. Unchanged minimally angulated left inferior pubic rami fractures. No fluid collection. Postsurgical subcutaneous emphysema in the proximal left gluteus medius muscle and overlying subcutaneous tissues. IMPRESSION: 1. Interval  bilateral sacroiliac joint and left superior pubic ramus ORIF. No pelvic diastasis. 2. Unchanged left anterior sacral ala, left posterior iliac wing, left parasymphyseal, and left inferior pubic rami fractures. 3. Decreased anterior pelvic hematoma in the prevesical space. 4. New small presacral hematoma. Electronically Signed   By:  Obie DredgeWilliam T Derry M.D.   On: 01/23/2018 09:22   Dg Chest Port 1 View  Result Date: 01/25/2018 CLINICAL DATA:  Follow-up chest tube EXAM: PORTABLE CHEST 1 VIEW COMPARISON:  01/24/2018 FINDINGS: Left-sided chest tube is noted and stable. No pneumothorax is identified. Stable right upper lobe airspace opacity is noted likely related to contusion. Rib fractures are again seen on the left. IMPRESSION: No pneumothorax identified. Patchy infiltrate in the right upper lobe stable from the previous exam. Electronically Signed   By: Alcide CleverMark  Lukens M.D.   On: 01/25/2018 07:34   Dg Chest Port 1 View  Result Date: 01/24/2018 CLINICAL DATA:  Followup traumatic left pneumothorax and rib fractures. EXAM: PORTABLE CHEST 1 VIEW COMPARISON:  01/23/2018 FINDINGS: Stable mild cardiomegaly. Left pleural pigtail catheter remains in place. No residual pneumothorax visualized. Multiple left rib fractures are again noted. Pulmonary airspace disease in the right upper lobe shows no significant change. IMPRESSION: 1. No residual pneumothorax visualized. Multiple left rib fractures again noted. 2. Stable right upper lobe airspace disease. Electronically Signed   By: Myles RosenthalJohn  Stahl M.D.   On: 01/24/2018 07:35   Dg Chest Port 1 View  Result Date: 01/23/2018 CLINICAL DATA:  Patient suffered left rib fractures in a fall from a roof 01/17/2018. Left chest tube in place. EXAM: PORTABLE CHEST 1 VIEW COMPARISON:  Single view of the chest earlier today and 01/21/2018. FINDINGS: New pigtail catheter is in place in the left chest. Left pneumothorax seen on the examination earlier today has resolved. Subcutaneous emphysema is seen along the left chest wall. Hazy airspace opacity in the right upper lobe is again seen. Left rib fractures noted. IMPRESSION: Resolved left pneumothorax with the new chest tube in place. Hazy airspace disease in the right upper lobe as seen on the examination earlier today could be due to developing pneumonia. Left rib  fractures as seen on prior studies. Electronically Signed   By: Drusilla Kannerhomas  Dalessio M.D.   On: 01/23/2018 11:58   Dg Chest Port 1 View  Result Date: 01/23/2018 CLINICAL DATA:  SOB for a few minutes when he was not wearing his O2. No longer having sob by time of cxr when he was wearing O2 canula. Denies any chest pains. Reports he fell from a height of 20 feet last Thursday. Broken ribs on left side. Reports thick chest congestion but cannot cough it up. Hx of x2 prior MI, htn controlled with medication, copd. Hx of coronary angioplasty with stent placement, chest tube insertion left 01/17/2018. 40 year smoker. EXAM: PORTABLE CHEST - 1 VIEW COMPARISON:  01/21/2018 and previous FINDINGS: Recurrent moderately large left pneumothorax of potentially greater than 50%. Stable left lateral subcutaneous emphysema. Coarse interstitial opacities in the right upper lobe, increased since previous. Heart size and mediastinal contours are within normal limits. No effusion. Minimally displaced fracture of lateral aspect left fifth and sixth ribs. IMPRESSION: 1. Recurrent moderately large left pneumothorax. Critical Value/emergent results were called by telephone at the time of interpretation on 01/23/2018 at 9:52 am to Dr. Carlena BjornstadBROOKE Anna Beaird , who verbally acknowledged these results. Electronically Signed   By: Corlis Leak  Hassell M.D.   On: 01/23/2018 09:53    Anti-infectives: Anti-infectives (From admission, onward)  Start     Dose/Rate Route Frequency Ordered Stop   01/23/18 0200  ceFAZolin (ANCEF) IVPB 2g/100 mL premix     2 g 200 mL/hr over 30 Minutes Intravenous Every 8 hours 01/22/18 2047 01/23/18 2219       Assessment/Plan Fall from roof Concussion- SLP  L T1 TVP fx- no back pain, PT/OT L rib fxs withHPTX- initialCT removed 01/12, replaced 1/15. CXR without PNX today, place chest tube to water seal and repeat CXR in AM. Continue pain control, IS, pulm toilet LC2 pelvic fracture-s/p perc fixation 1/14 Dr. Jena Gauss,  TDWB LLE Lacromionfx- per ortho, sling, NWB LUE Elevated LFT's- tbili normal, AST/ALTWNL, ALPtrending down (1/16) Hyponatremia- 136,resolved Hypokalemia - K3.8, resolved HTN-home meds Hx of CAD s/p stent x2  FEN:2g Nadiet,KVOIVF VTE: SCDs, lovenox PO:EUMPN periop. WBC trending down, afebrile Follow up: Trauma, ortho Foley:out Pain: IV dilaudid breakthrough; oxy 10-15mg  q4hr; scheduled tylenol 1000mg  TID, robaxin 750mg  TID, tramadol 50mg  q6hr  Plan:Chest tube to waterseal. Continue PT/OT. Patient may be ready for discharge tomorrow. Home health PT/OT and DME ordered.   LOS: 8 days    Franne Forts , [facility] 01/25/2018, 7:51 AM Pager: [phone] Mon-Thurs 7:00 am-4:30 pm Fri 7:00 am -11:30 AM Sat-Sun 7:00 am-11:30 am

## 2018-01-25 NOTE — Progress Notes (Signed)
Ortho Trauma Progress Note  Pain in pelvis much improved. Mobilizing better.  Plan: Continue TDWB LLE, NWB LUE in sling Okay to discharge from orthopaedic perspective, plan to follow up in 2 weeks for repeat x-rays  Roby Lofts, MD [facility] [phone] (phone)

## 2018-01-26 ENCOUNTER — Inpatient Hospital Stay (HOSPITAL_COMMUNITY): Payer: BLUE CROSS/BLUE SHIELD

## 2018-01-26 DIAGNOSIS — S32811A Multiple fractures of pelvis with unstable disruption of pelvic ring, initial encounter for closed fracture: Secondary | ICD-10-CM | POA: Diagnosis not present

## 2018-01-31 ENCOUNTER — Encounter: Payer: Self-pay | Admitting: Student

## 2018-01-31 DIAGNOSIS — S329XXA Fracture of unspecified parts of lumbosacral spine and pelvis, initial encounter for closed fracture: Secondary | ICD-10-CM | POA: Insufficient documentation

## 2018-02-04 ENCOUNTER — Ambulatory Visit
Admission: RE | Admit: 2018-02-04 | Discharge: 2018-02-04 | Disposition: A | Discharge status: Home / Self Care | Payer: BLUE CROSS/BLUE SHIELD | Source: Ambulatory Visit | Attending: Physician Assistant | Admitting: Physician Assistant

## 2018-02-04 ENCOUNTER — Other Ambulatory Visit: Payer: Self-pay | Admitting: Physician Assistant

## 2018-02-04 DIAGNOSIS — S299XXA Unspecified injury of thorax, initial encounter: Secondary | ICD-10-CM | POA: Diagnosis not present

## 2018-02-04 DIAGNOSIS — J95811 Postprocedural pneumothorax: Secondary | ICD-10-CM

## 2018-02-05 DIAGNOSIS — S272XXS Traumatic hemopneumothorax, sequela: Secondary | ICD-10-CM | POA: Diagnosis not present

## 2018-02-05 DIAGNOSIS — Z8709 Personal history of other diseases of the respiratory system: Secondary | ICD-10-CM | POA: Diagnosis not present

## 2018-02-12 DIAGNOSIS — S4292XA Fracture of left shoulder girdle, part unspecified, initial encounter for closed fracture: Secondary | ICD-10-CM | POA: Diagnosis not present

## 2018-02-12 DIAGNOSIS — S329XXD Fracture of unspecified parts of lumbosacral spine and pelvis, subsequent encounter for fracture with routine healing: Secondary | ICD-10-CM | POA: Diagnosis not present

## 2018-02-26 DIAGNOSIS — S32811A Multiple fractures of pelvis with unstable disruption of pelvic ring, initial encounter for closed fracture: Secondary | ICD-10-CM | POA: Diagnosis not present

## 2018-03-12 DIAGNOSIS — M25562 Pain in left knee: Secondary | ICD-10-CM | POA: Diagnosis not present

## 2018-03-12 DIAGNOSIS — S4292XD Fracture of left shoulder girdle, part unspecified, subsequent encounter for fracture with routine healing: Secondary | ICD-10-CM | POA: Diagnosis not present

## 2018-03-12 DIAGNOSIS — S329XXD Fracture of unspecified parts of lumbosacral spine and pelvis, subsequent encounter for fracture with routine healing: Secondary | ICD-10-CM | POA: Diagnosis not present

## 2018-03-27 DIAGNOSIS — S32811A Multiple fractures of pelvis with unstable disruption of pelvic ring, initial encounter for closed fracture: Secondary | ICD-10-CM | POA: Diagnosis not present

## 2018-04-01 DIAGNOSIS — E785 Hyperlipidemia, unspecified: Secondary | ICD-10-CM | POA: Diagnosis not present

## 2018-04-01 DIAGNOSIS — R05 Cough: Secondary | ICD-10-CM | POA: Diagnosis not present

## 2018-04-01 DIAGNOSIS — F17210 Nicotine dependence, cigarettes, uncomplicated: Secondary | ICD-10-CM | POA: Diagnosis not present

## 2018-04-01 DIAGNOSIS — I2510 Atherosclerotic heart disease of native coronary artery without angina pectoris: Secondary | ICD-10-CM | POA: Diagnosis not present

## 2018-04-01 DIAGNOSIS — K219 Gastro-esophageal reflux disease without esophagitis: Secondary | ICD-10-CM | POA: Diagnosis not present

## 2018-04-01 DIAGNOSIS — J069 Acute upper respiratory infection, unspecified: Secondary | ICD-10-CM | POA: Diagnosis not present

## 2018-04-01 DIAGNOSIS — Z79899 Other long term (current) drug therapy: Secondary | ICD-10-CM | POA: Diagnosis not present

## 2018-04-01 DIAGNOSIS — I10 Essential (primary) hypertension: Secondary | ICD-10-CM | POA: Diagnosis not present

## 2018-04-01 DIAGNOSIS — J449 Chronic obstructive pulmonary disease, unspecified: Secondary | ICD-10-CM | POA: Diagnosis not present

## 2018-04-27 DIAGNOSIS — S32811A Multiple fractures of pelvis with unstable disruption of pelvic ring, initial encounter for closed fracture: Secondary | ICD-10-CM | POA: Diagnosis not present

## 2018-05-27 DIAGNOSIS — S32811A Multiple fractures of pelvis with unstable disruption of pelvic ring, initial encounter for closed fracture: Secondary | ICD-10-CM | POA: Diagnosis not present

## 2018-06-27 DIAGNOSIS — S32811A Multiple fractures of pelvis with unstable disruption of pelvic ring, initial encounter for closed fracture: Secondary | ICD-10-CM | POA: Diagnosis not present

## 2018-07-27 DIAGNOSIS — S32811A Multiple fractures of pelvis with unstable disruption of pelvic ring, initial encounter for closed fracture: Secondary | ICD-10-CM | POA: Diagnosis not present

## 2018-08-27 DIAGNOSIS — S32811A Multiple fractures of pelvis with unstable disruption of pelvic ring, initial encounter for closed fracture: Secondary | ICD-10-CM | POA: Diagnosis not present

## 2018-09-09 DIAGNOSIS — E871 Hypo-osmolality and hyponatremia: Secondary | ICD-10-CM | POA: Diagnosis not present

## 2018-09-09 DIAGNOSIS — J929 Pleural plaque without asbestos: Secondary | ICD-10-CM | POA: Diagnosis not present

## 2018-09-09 DIAGNOSIS — R600 Localized edema: Secondary | ICD-10-CM | POA: Diagnosis not present

## 2018-09-09 DIAGNOSIS — M7989 Other specified soft tissue disorders: Secondary | ICD-10-CM | POA: Diagnosis not present

## 2018-09-10 DIAGNOSIS — I517 Cardiomegaly: Secondary | ICD-10-CM | POA: Diagnosis not present

## 2018-09-15 DIAGNOSIS — R55 Syncope and collapse: Secondary | ICD-10-CM | POA: Diagnosis not present

## 2018-09-15 DIAGNOSIS — R404 Transient alteration of awareness: Secondary | ICD-10-CM | POA: Diagnosis not present

## 2018-09-15 DIAGNOSIS — R4182 Altered mental status, unspecified: Secondary | ICD-10-CM | POA: Diagnosis not present

## 2018-09-15 DIAGNOSIS — R4781 Slurred speech: Secondary | ICD-10-CM | POA: Diagnosis not present

## 2018-09-17 DIAGNOSIS — I10 Essential (primary) hypertension: Secondary | ICD-10-CM | POA: Diagnosis not present

## 2018-09-17 DIAGNOSIS — D72829 Elevated white blood cell count, unspecified: Secondary | ICD-10-CM | POA: Diagnosis not present

## 2018-09-17 DIAGNOSIS — R300 Dysuria: Secondary | ICD-10-CM | POA: Diagnosis not present

## 2018-09-17 DIAGNOSIS — F1910 Other psychoactive substance abuse, uncomplicated: Secondary | ICD-10-CM | POA: Diagnosis not present

## 2018-09-23 DIAGNOSIS — Z79899 Other long term (current) drug therapy: Secondary | ICD-10-CM | POA: Diagnosis not present

## 2018-09-27 DIAGNOSIS — S32811A Multiple fractures of pelvis with unstable disruption of pelvic ring, initial encounter for closed fracture: Secondary | ICD-10-CM | POA: Diagnosis not present

## 2018-10-27 DIAGNOSIS — S32811A Multiple fractures of pelvis with unstable disruption of pelvic ring, initial encounter for closed fracture: Secondary | ICD-10-CM | POA: Diagnosis not present

## 2018-10-31 DIAGNOSIS — G8929 Other chronic pain: Secondary | ICD-10-CM | POA: Diagnosis not present

## 2018-10-31 DIAGNOSIS — M79602 Pain in left arm: Secondary | ICD-10-CM | POA: Diagnosis not present

## 2018-10-31 DIAGNOSIS — M25512 Pain in left shoulder: Secondary | ICD-10-CM | POA: Diagnosis not present

## 2018-10-31 DIAGNOSIS — M898X1 Other specified disorders of bone, shoulder: Secondary | ICD-10-CM | POA: Diagnosis not present

## 2018-11-21 DIAGNOSIS — J449 Chronic obstructive pulmonary disease, unspecified: Secondary | ICD-10-CM | POA: Diagnosis not present

## 2018-11-21 DIAGNOSIS — R1110 Vomiting, unspecified: Secondary | ICD-10-CM | POA: Diagnosis not present

## 2018-11-21 DIAGNOSIS — R5383 Other fatigue: Secondary | ICD-10-CM | POA: Diagnosis not present

## 2018-11-21 DIAGNOSIS — K219 Gastro-esophageal reflux disease without esophagitis: Secondary | ICD-10-CM | POA: Diagnosis not present

## 2018-11-21 DIAGNOSIS — K922 Gastrointestinal hemorrhage, unspecified: Secondary | ICD-10-CM | POA: Diagnosis not present

## 2018-11-21 DIAGNOSIS — Z79899 Other long term (current) drug therapy: Secondary | ICD-10-CM | POA: Diagnosis not present

## 2018-11-21 DIAGNOSIS — F17210 Nicotine dependence, cigarettes, uncomplicated: Secondary | ICD-10-CM | POA: Diagnosis not present

## 2018-11-21 DIAGNOSIS — R1013 Epigastric pain: Secondary | ICD-10-CM | POA: Diagnosis not present

## 2018-11-21 DIAGNOSIS — E785 Hyperlipidemia, unspecified: Secondary | ICD-10-CM | POA: Diagnosis not present

## 2018-11-21 DIAGNOSIS — R531 Weakness: Secondary | ICD-10-CM | POA: Diagnosis not present

## 2018-11-21 DIAGNOSIS — K279 Peptic ulcer, site unspecified, unspecified as acute or chronic, without hemorrhage or perforation: Secondary | ICD-10-CM | POA: Diagnosis not present

## 2018-11-21 DIAGNOSIS — I10 Essential (primary) hypertension: Secondary | ICD-10-CM | POA: Diagnosis not present

## 2018-11-22 DIAGNOSIS — K219 Gastro-esophageal reflux disease without esophagitis: Secondary | ICD-10-CM | POA: Diagnosis not present

## 2018-11-22 DIAGNOSIS — K269 Duodenal ulcer, unspecified as acute or chronic, without hemorrhage or perforation: Secondary | ICD-10-CM | POA: Diagnosis not present

## 2018-11-22 DIAGNOSIS — B3781 Candidal esophagitis: Secondary | ICD-10-CM | POA: Diagnosis not present

## 2018-11-22 DIAGNOSIS — R1111 Vomiting without nausea: Secondary | ICD-10-CM | POA: Diagnosis not present

## 2018-11-22 DIAGNOSIS — R1084 Generalized abdominal pain: Secondary | ICD-10-CM | POA: Diagnosis not present

## 2018-11-22 DIAGNOSIS — K921 Melena: Secondary | ICD-10-CM | POA: Diagnosis not present

## 2018-11-22 DIAGNOSIS — R0902 Hypoxemia: Secondary | ICD-10-CM | POA: Diagnosis not present

## 2018-11-22 DIAGNOSIS — K229 Disease of esophagus, unspecified: Secondary | ICD-10-CM | POA: Diagnosis not present

## 2018-11-22 DIAGNOSIS — K2950 Unspecified chronic gastritis without bleeding: Secondary | ICD-10-CM | POA: Diagnosis not present

## 2018-11-22 DIAGNOSIS — I2510 Atherosclerotic heart disease of native coronary artery without angina pectoris: Secondary | ICD-10-CM | POA: Diagnosis not present

## 2018-11-22 DIAGNOSIS — K2980 Duodenitis without bleeding: Secondary | ICD-10-CM | POA: Diagnosis not present

## 2018-11-22 DIAGNOSIS — D72829 Elevated white blood cell count, unspecified: Secondary | ICD-10-CM | POA: Diagnosis not present

## 2018-11-22 DIAGNOSIS — B9681 Helicobacter pylori [H. pylori] as the cause of diseases classified elsewhere: Secondary | ICD-10-CM | POA: Diagnosis not present

## 2018-11-22 DIAGNOSIS — R110 Nausea: Secondary | ICD-10-CM | POA: Diagnosis not present

## 2018-11-22 DIAGNOSIS — R197 Diarrhea, unspecified: Secondary | ICD-10-CM | POA: Diagnosis not present

## 2018-11-22 DIAGNOSIS — K922 Gastrointestinal hemorrhage, unspecified: Secondary | ICD-10-CM | POA: Diagnosis not present

## 2018-11-23 DIAGNOSIS — K922 Gastrointestinal hemorrhage, unspecified: Secondary | ICD-10-CM | POA: Diagnosis not present

## 2018-11-23 DIAGNOSIS — K219 Gastro-esophageal reflux disease without esophagitis: Secondary | ICD-10-CM | POA: Diagnosis not present

## 2018-11-23 DIAGNOSIS — I2510 Atherosclerotic heart disease of native coronary artery without angina pectoris: Secondary | ICD-10-CM | POA: Diagnosis not present

## 2018-11-23 DIAGNOSIS — D72829 Elevated white blood cell count, unspecified: Secondary | ICD-10-CM | POA: Diagnosis not present

## 2018-12-02 DIAGNOSIS — R109 Unspecified abdominal pain: Secondary | ICD-10-CM | POA: Diagnosis not present

## 2018-12-02 DIAGNOSIS — K269 Duodenal ulcer, unspecified as acute or chronic, without hemorrhage or perforation: Secondary | ICD-10-CM | POA: Diagnosis not present

## 2018-12-21 DIAGNOSIS — I10 Essential (primary) hypertension: Secondary | ICD-10-CM | POA: Diagnosis not present

## 2018-12-21 DIAGNOSIS — S161XXA Strain of muscle, fascia and tendon at neck level, initial encounter: Secondary | ICD-10-CM | POA: Diagnosis not present

## 2018-12-21 DIAGNOSIS — I6523 Occlusion and stenosis of bilateral carotid arteries: Secondary | ICD-10-CM | POA: Diagnosis not present

## 2018-12-21 DIAGNOSIS — F17210 Nicotine dependence, cigarettes, uncomplicated: Secondary | ICD-10-CM | POA: Diagnosis not present

## 2018-12-21 DIAGNOSIS — F329 Major depressive disorder, single episode, unspecified: Secondary | ICD-10-CM | POA: Diagnosis not present

## 2018-12-21 DIAGNOSIS — M542 Cervicalgia: Secondary | ICD-10-CM | POA: Diagnosis not present

## 2018-12-21 DIAGNOSIS — K219 Gastro-esophageal reflux disease without esophagitis: Secondary | ICD-10-CM | POA: Diagnosis not present

## 2018-12-21 DIAGNOSIS — J439 Emphysema, unspecified: Secondary | ICD-10-CM | POA: Diagnosis not present

## 2018-12-21 DIAGNOSIS — I2510 Atherosclerotic heart disease of native coronary artery without angina pectoris: Secondary | ICD-10-CM | POA: Diagnosis not present

## 2018-12-21 DIAGNOSIS — S0990XA Unspecified injury of head, initial encounter: Secondary | ICD-10-CM | POA: Diagnosis not present

## 2018-12-21 DIAGNOSIS — R519 Headache, unspecified: Secondary | ICD-10-CM | POA: Diagnosis not present

## 2018-12-21 DIAGNOSIS — Z7982 Long term (current) use of aspirin: Secondary | ICD-10-CM | POA: Diagnosis not present

## 2018-12-21 DIAGNOSIS — Z79899 Other long term (current) drug therapy: Secondary | ICD-10-CM | POA: Diagnosis not present

## 2018-12-21 DIAGNOSIS — E785 Hyperlipidemia, unspecified: Secondary | ICD-10-CM | POA: Diagnosis not present

## 2018-12-21 DIAGNOSIS — S60011A Contusion of right thumb without damage to nail, initial encounter: Secondary | ICD-10-CM | POA: Diagnosis not present

## 2018-12-21 DIAGNOSIS — M79641 Pain in right hand: Secondary | ICD-10-CM | POA: Diagnosis not present

## 2018-12-21 DIAGNOSIS — S6991XA Unspecified injury of right wrist, hand and finger(s), initial encounter: Secondary | ICD-10-CM | POA: Diagnosis not present

## 2018-12-21 DIAGNOSIS — J449 Chronic obstructive pulmonary disease, unspecified: Secondary | ICD-10-CM | POA: Diagnosis not present

## 2018-12-21 DIAGNOSIS — E041 Nontoxic single thyroid nodule: Secondary | ICD-10-CM | POA: Diagnosis not present

## 2018-12-21 DIAGNOSIS — S199XXA Unspecified injury of neck, initial encounter: Secondary | ICD-10-CM | POA: Diagnosis not present

## 2019-07-25 DIAGNOSIS — Z01810 Encounter for preprocedural cardiovascular examination: Secondary | ICD-10-CM

## 2019-09-17 IMAGING — DX Imaging study
1 series · 1 of 1 positions shown · non-contrast
Comparison: None.

CLINICAL DATA: Unwitnessed fall from roof, left-sided chest and
shoulder pain

EXAM:
LEFT HUMERUS - 2+ VIEW

[humerus ap]
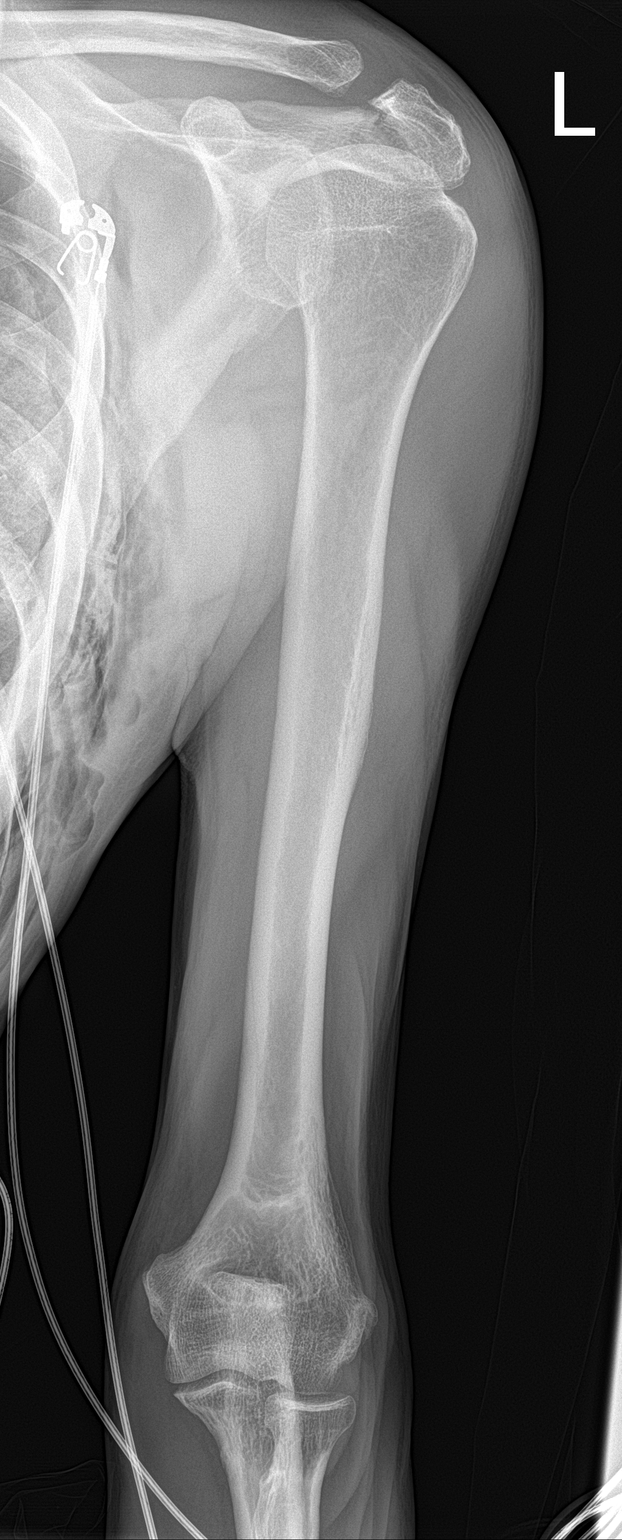

[1 of 1 positions shown; findings below may reference images not displayed]

FINDINGS: There does appear to be a fracture of the acromion of the left
shoulder with slight widening of the AC joint. The acromion is also
somewhat downward sloping. Left humeral head is intact and normally
position and the left humerus appears intact. Subcutaneous left
chest wall air is noted. The left pneumothorax again is noted.
IMPRESSION: 1. Nondisplaced fracture of the left acromion with slightly widened
left AC joint. CT may be helpful to assess further if warranted.
2. No other fracture is seen.
3. Left pneumothorax is noted with left chest wall subcutaneous air.
Adjacent left rib fractures are not as well seen.

## 2019-09-22 IMAGING — RF Imaging study
1 series · 15 of 16 positions shown · non-contrast
Comparison: Pelvic CT dated 01/17/2018 and 2 day pelvic radiographs
dated 01/21/2018.

CLINICAL DATA: Multiple pelvic fractures on a recent pelvic CT
following a fall from a roof.

EXAM:
DG C-ARM 61-120 MIN; JUDET PELVIS - 3+ VIEW

[Series 1: run · 15 of 22 slices shown]
[im 1/22]
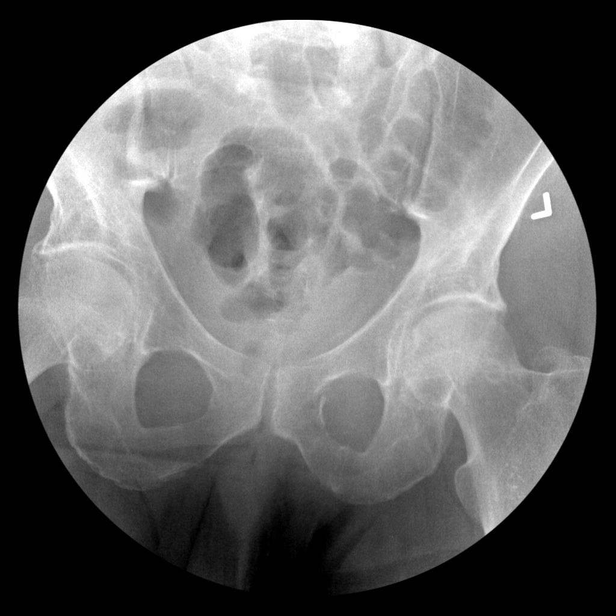
[im 2/22]
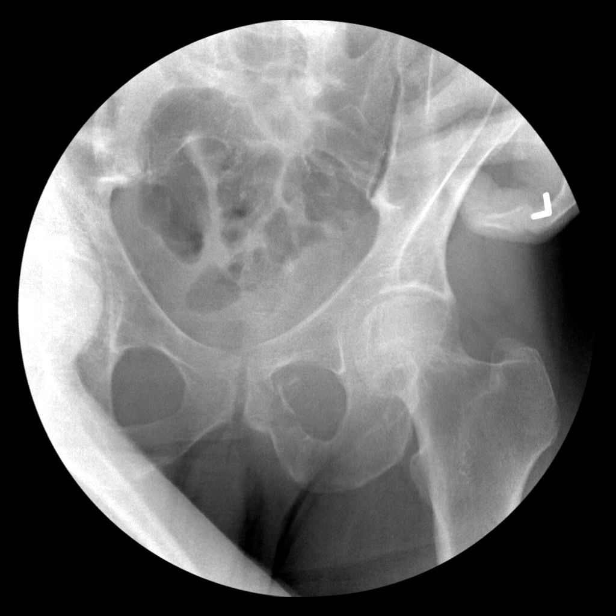
[im 3/22]
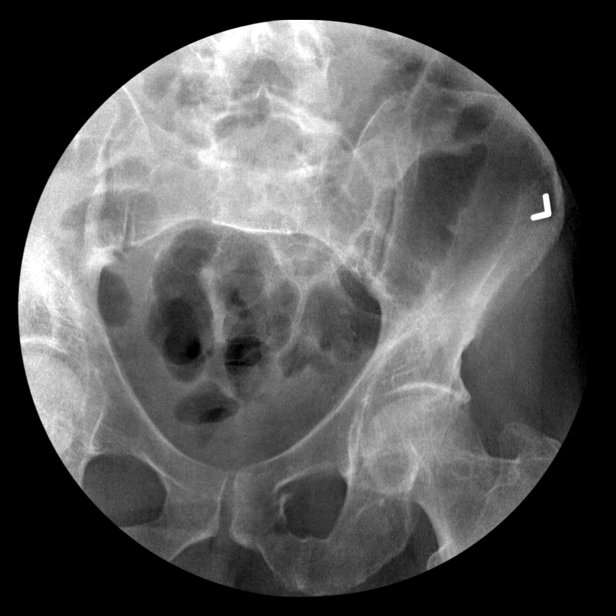
[im 5/22]
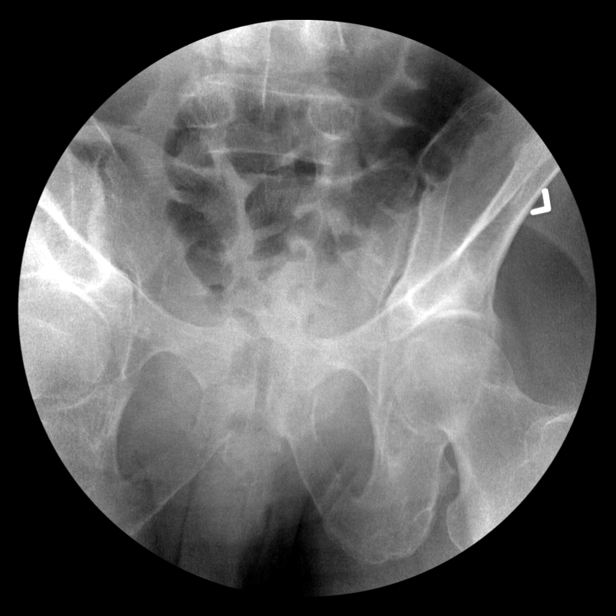
[im 6/22]
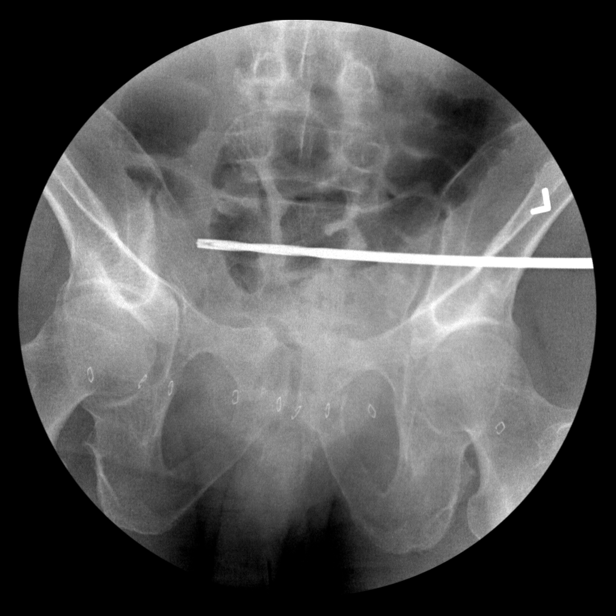
[im 8/22]
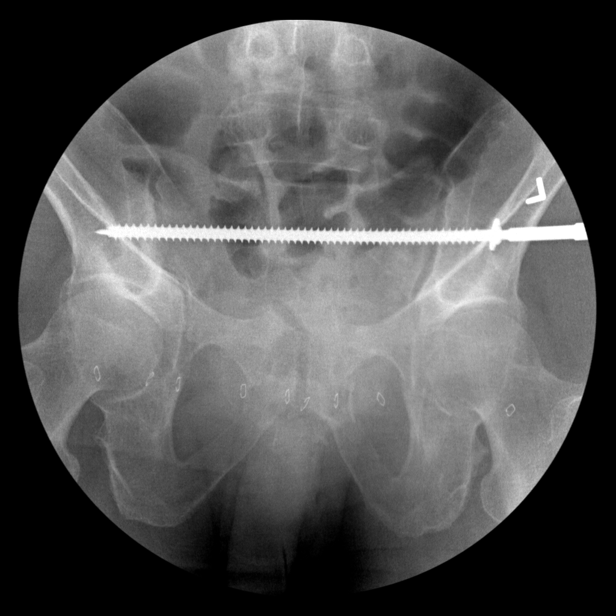
[im 9/22]
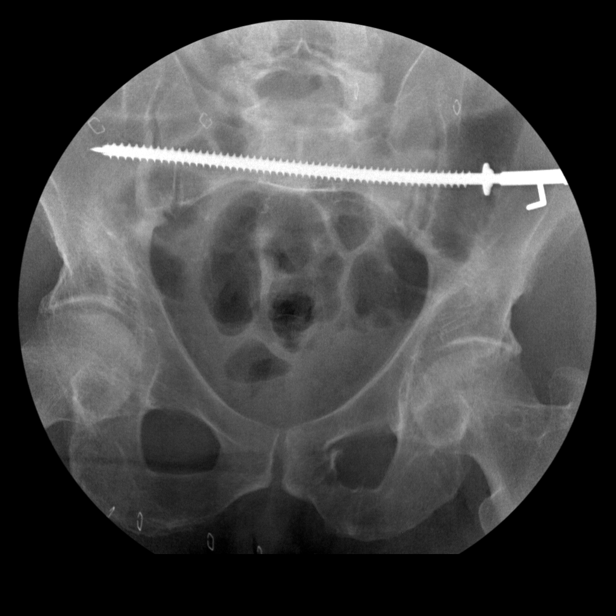
[im 12/22]
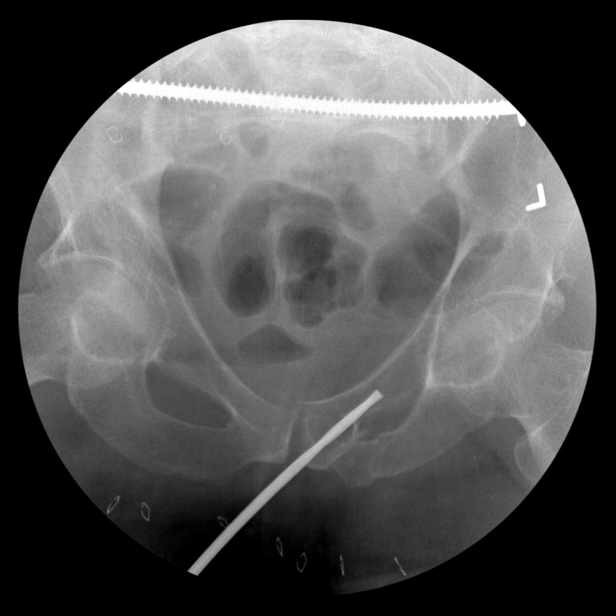
[im 13/22]
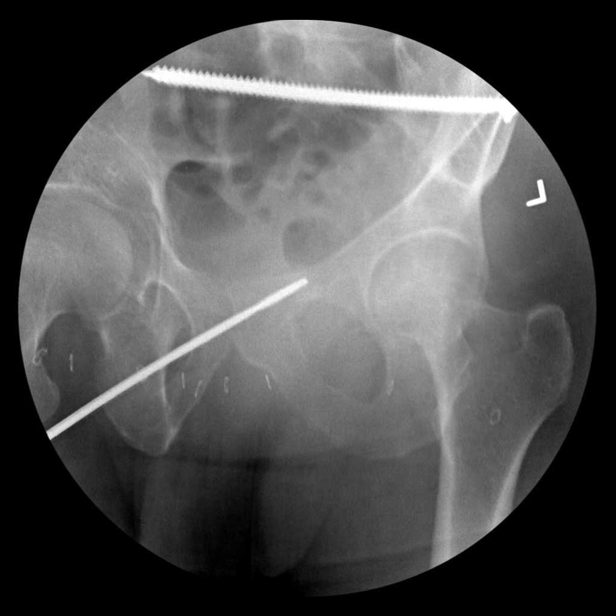
[im 15/22]
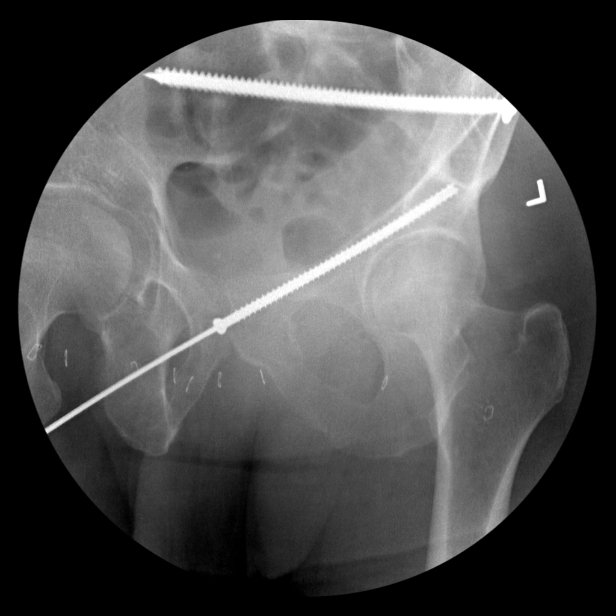
[im 16/22]
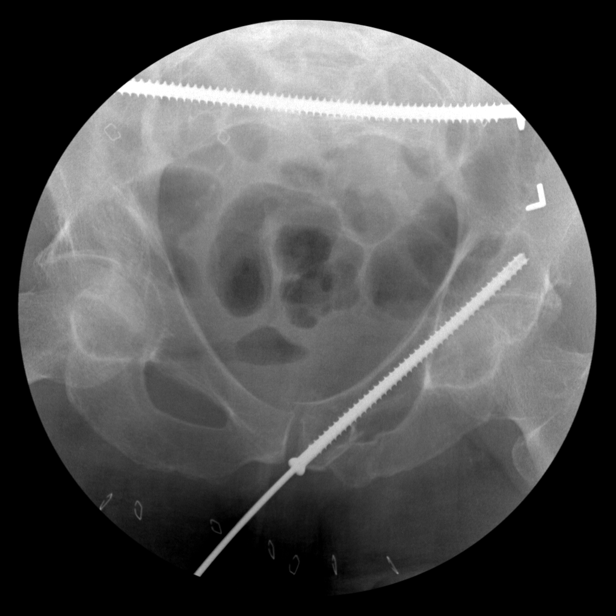
[im 17/22]
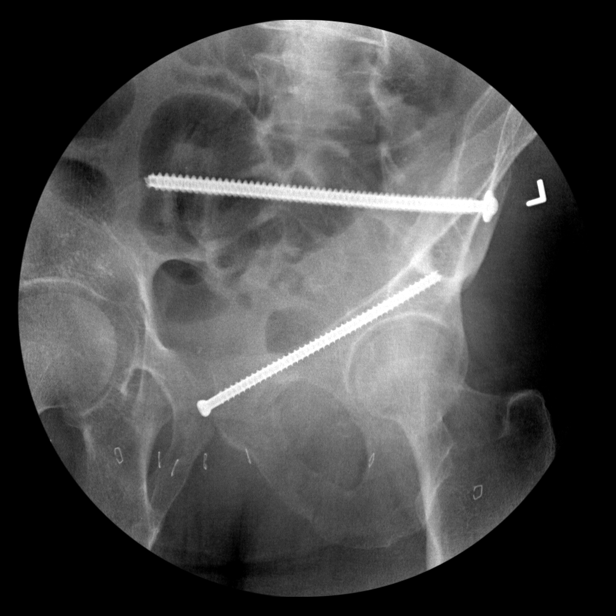
[im 19/22]
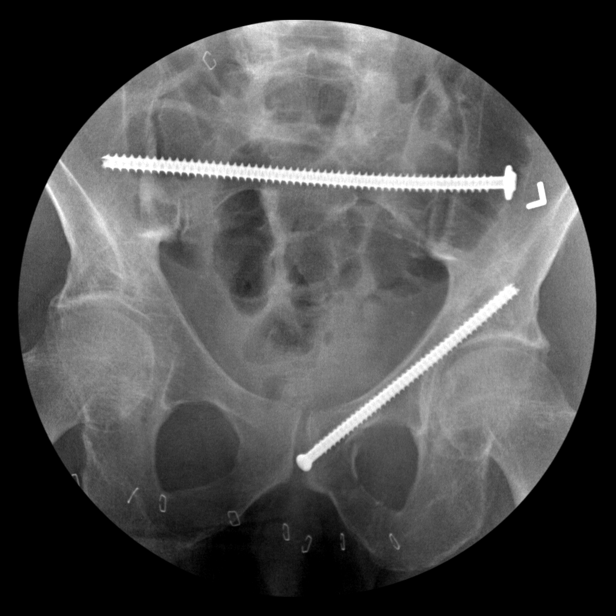
[im 20/22]
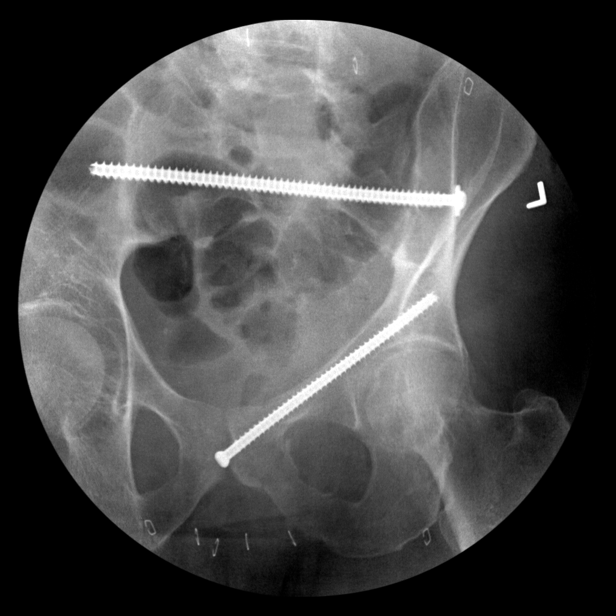
[im 22/22]
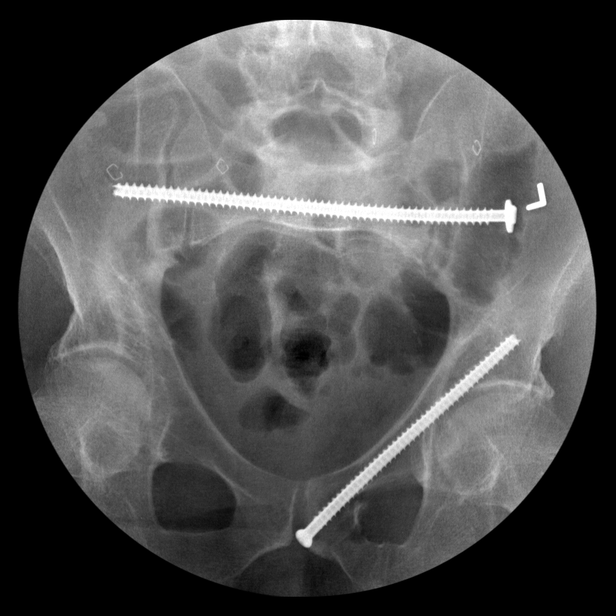

[15 of 16 positions shown; findings below may reference images not displayed]

FINDINGS: Twenty-two C-arm views of the pelvis are available for
interpretation. The previously demonstrated multiple pelvic
fractures are better visualized on the previous CT. The images
demonstrate placement of a screw bridging the sacrum and both iliac
bones. There is also a screw bridging the previously demonstrated
left pubic body and superior pubic ramus fractures. No internal
fixation of the left inferior pubic ramus fracture is seen.
IMPRESSION: Hardware fixation of the previously demonstrated pelvic fractures,
as described above.

## 2019-09-26 IMAGING — DX DG CHEST PORT 1 VIEW
1 series · 1 of 1 positions shown · non-contrast
Comparison: Chest radiograph from one day prior.

CLINICAL DATA: Left chest tube

EXAM:
PORTABLE CHEST 1 VIEW

[chest ap]
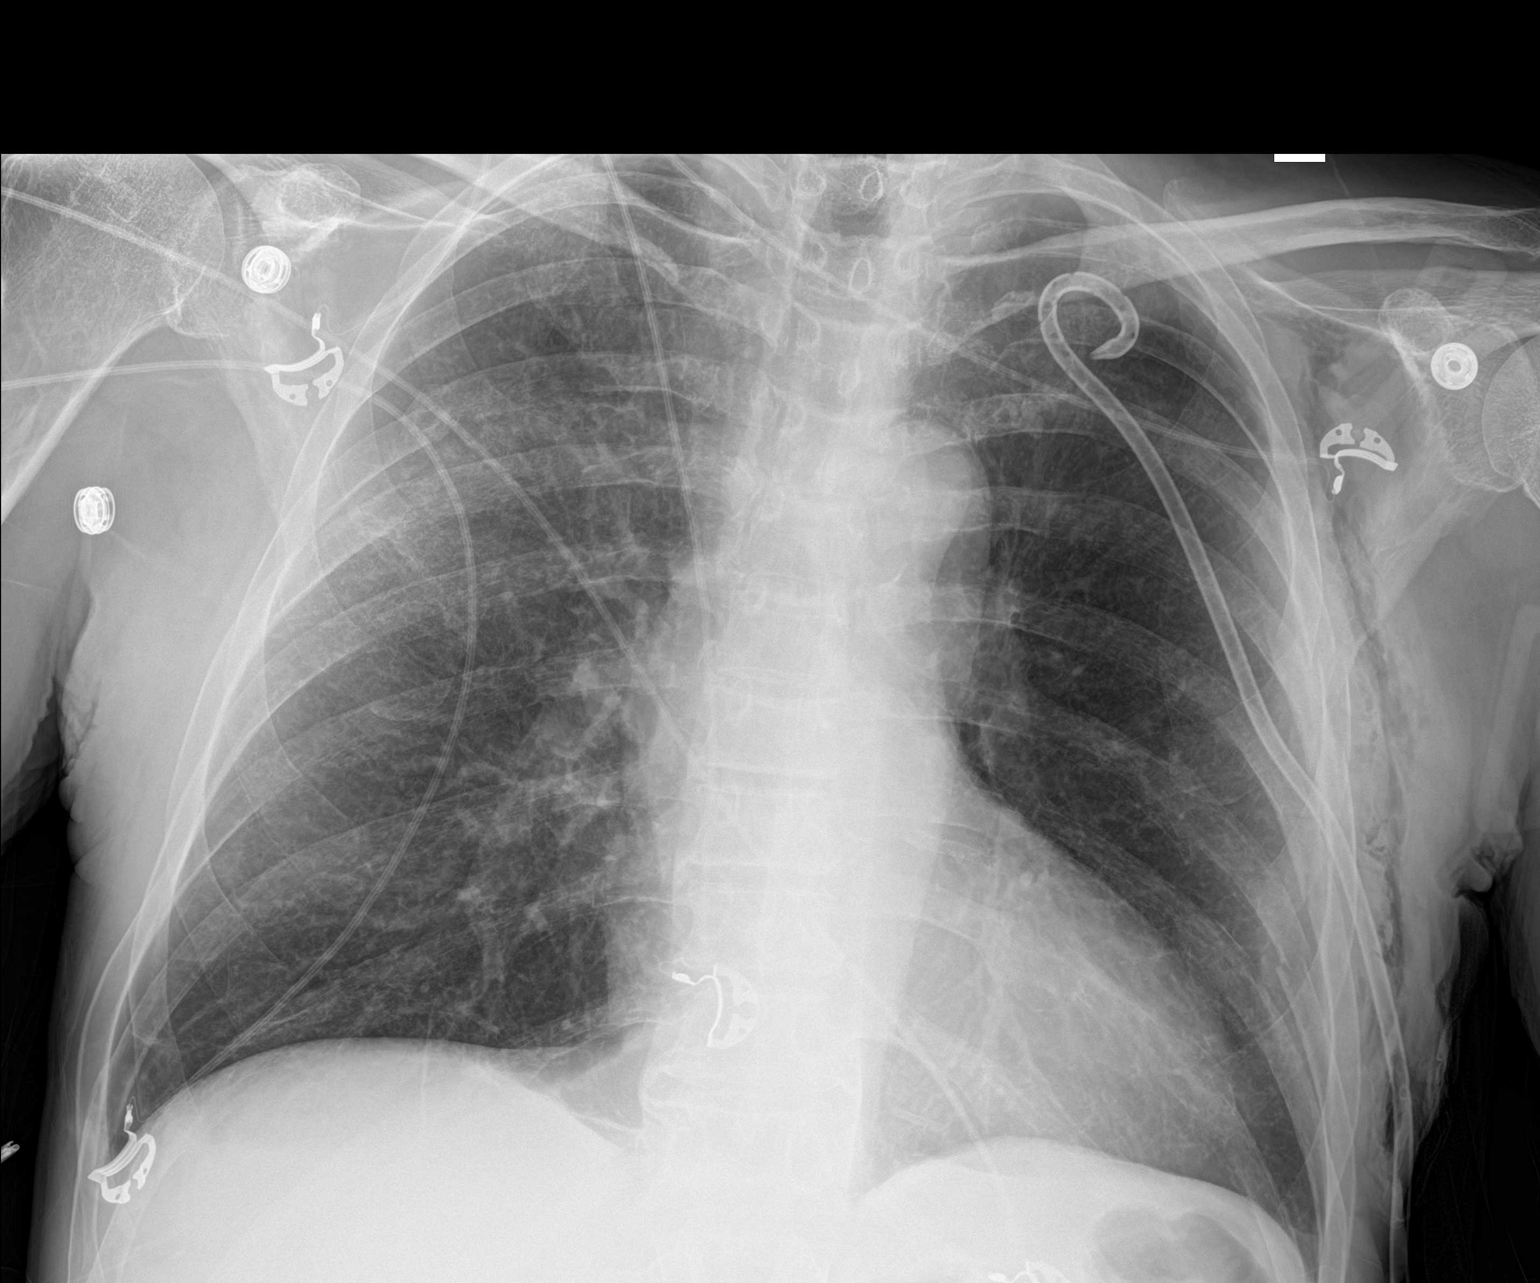

[1 of 1 positions shown; findings below may reference images not displayed]

FINDINGS: Stable position of apical left chest tube. Stable cardiomediastinal
silhouette with normal heart size. No pneumothorax. No pleural
effusion. Minimal hazy upper right opacity, unchanged. Stable mild
subcutaneous emphysema in the lateral left chest wall. Stable
multiple lateral left rib fractures.
IMPRESSION: No pneumothorax. Redemonstration of multiple lateral left rib
fractures.

Stable minimal hazy upper right lung opacity, favor mild
atelectasis.

## 2019-10-05 IMAGING — CR DG CHEST 2V
2 series · 2 of 2 positions shown · non-contrast
Comparison: None.

CLINICAL DATA: Fell 20 ft from ladder 3 weks prior / LEFT PNTX / no
chest complaints / concern for resolution / jdh [id number] POST
PNEUMOTHORAX

EXAM:
CHEST - 2 VIEW

[w chest pa]
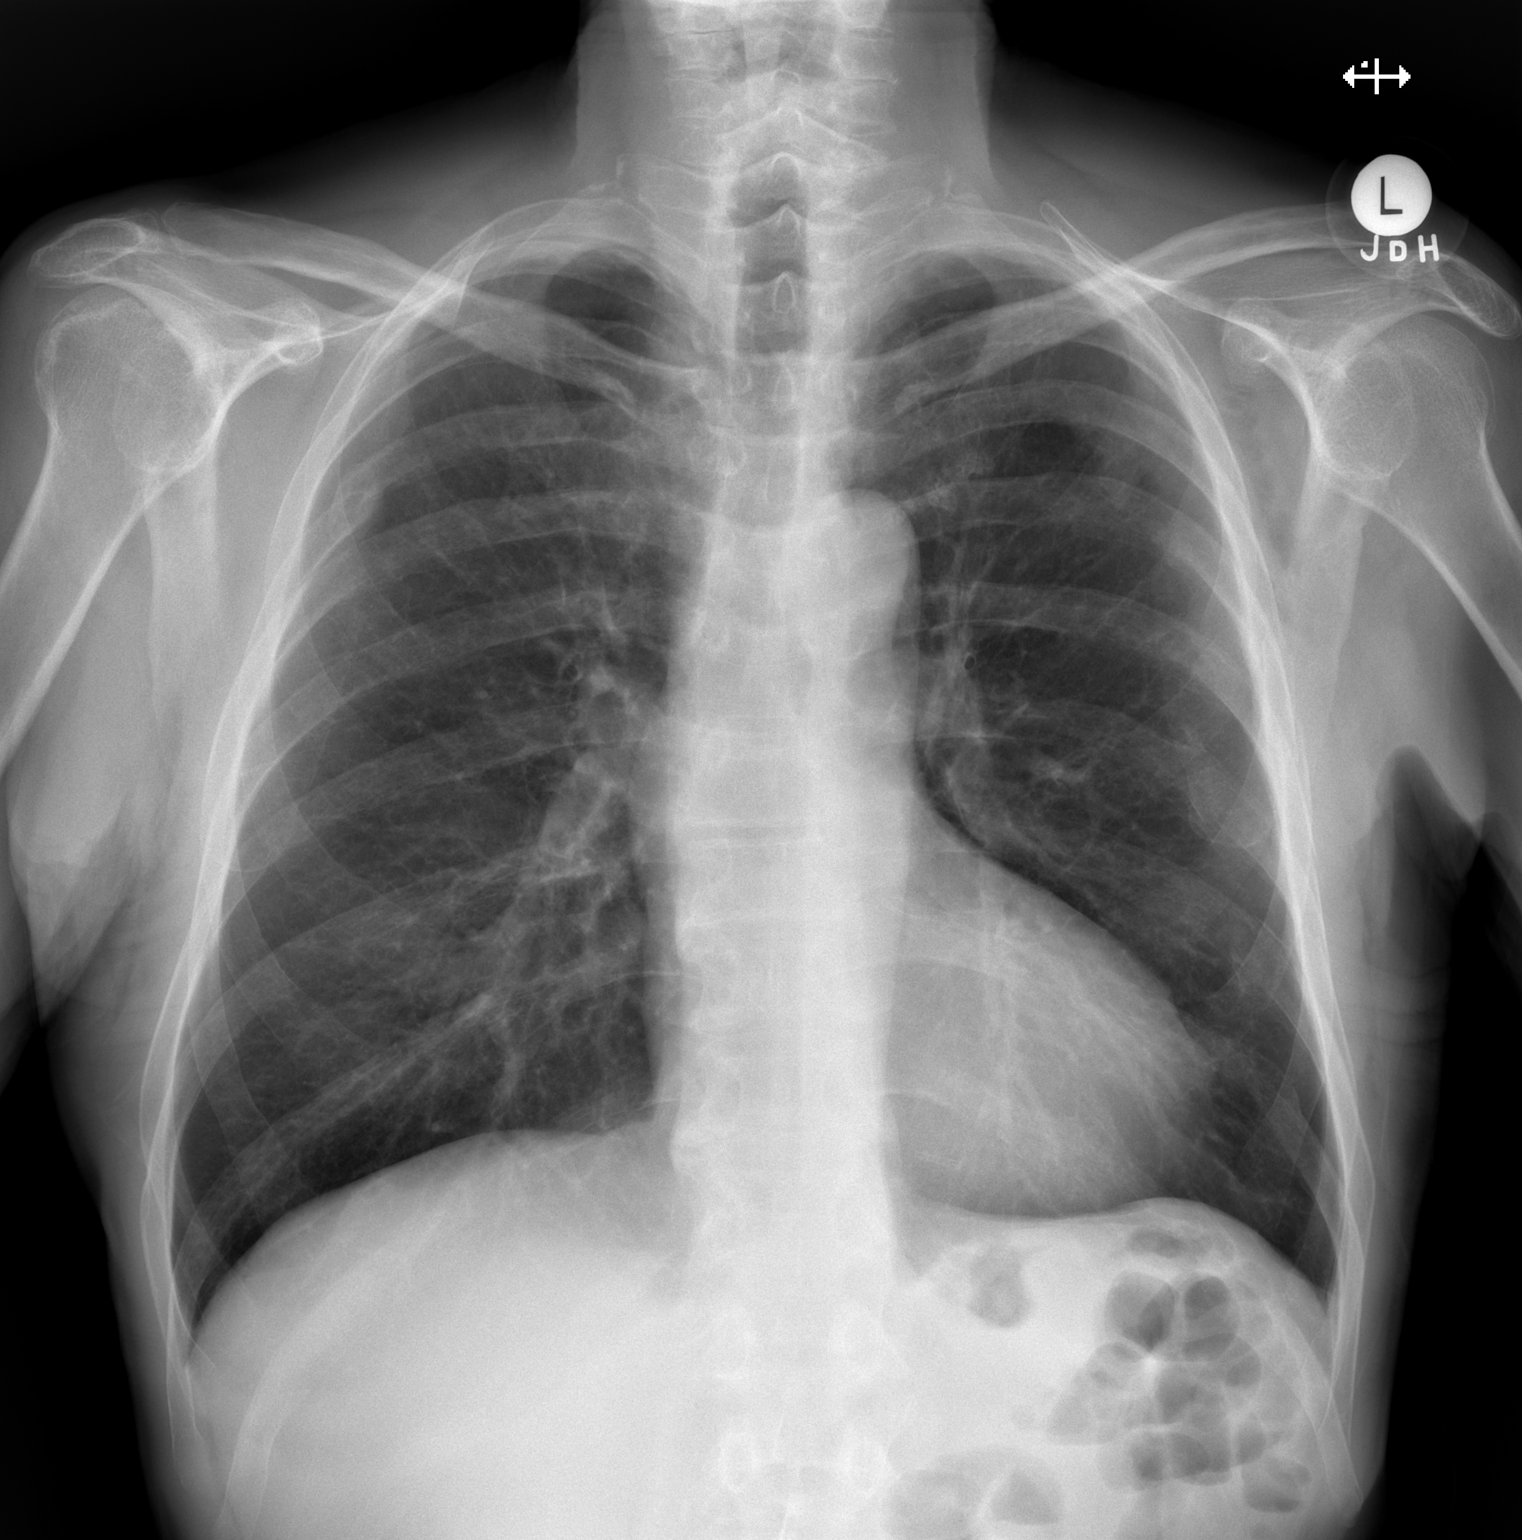

[w chest lat]
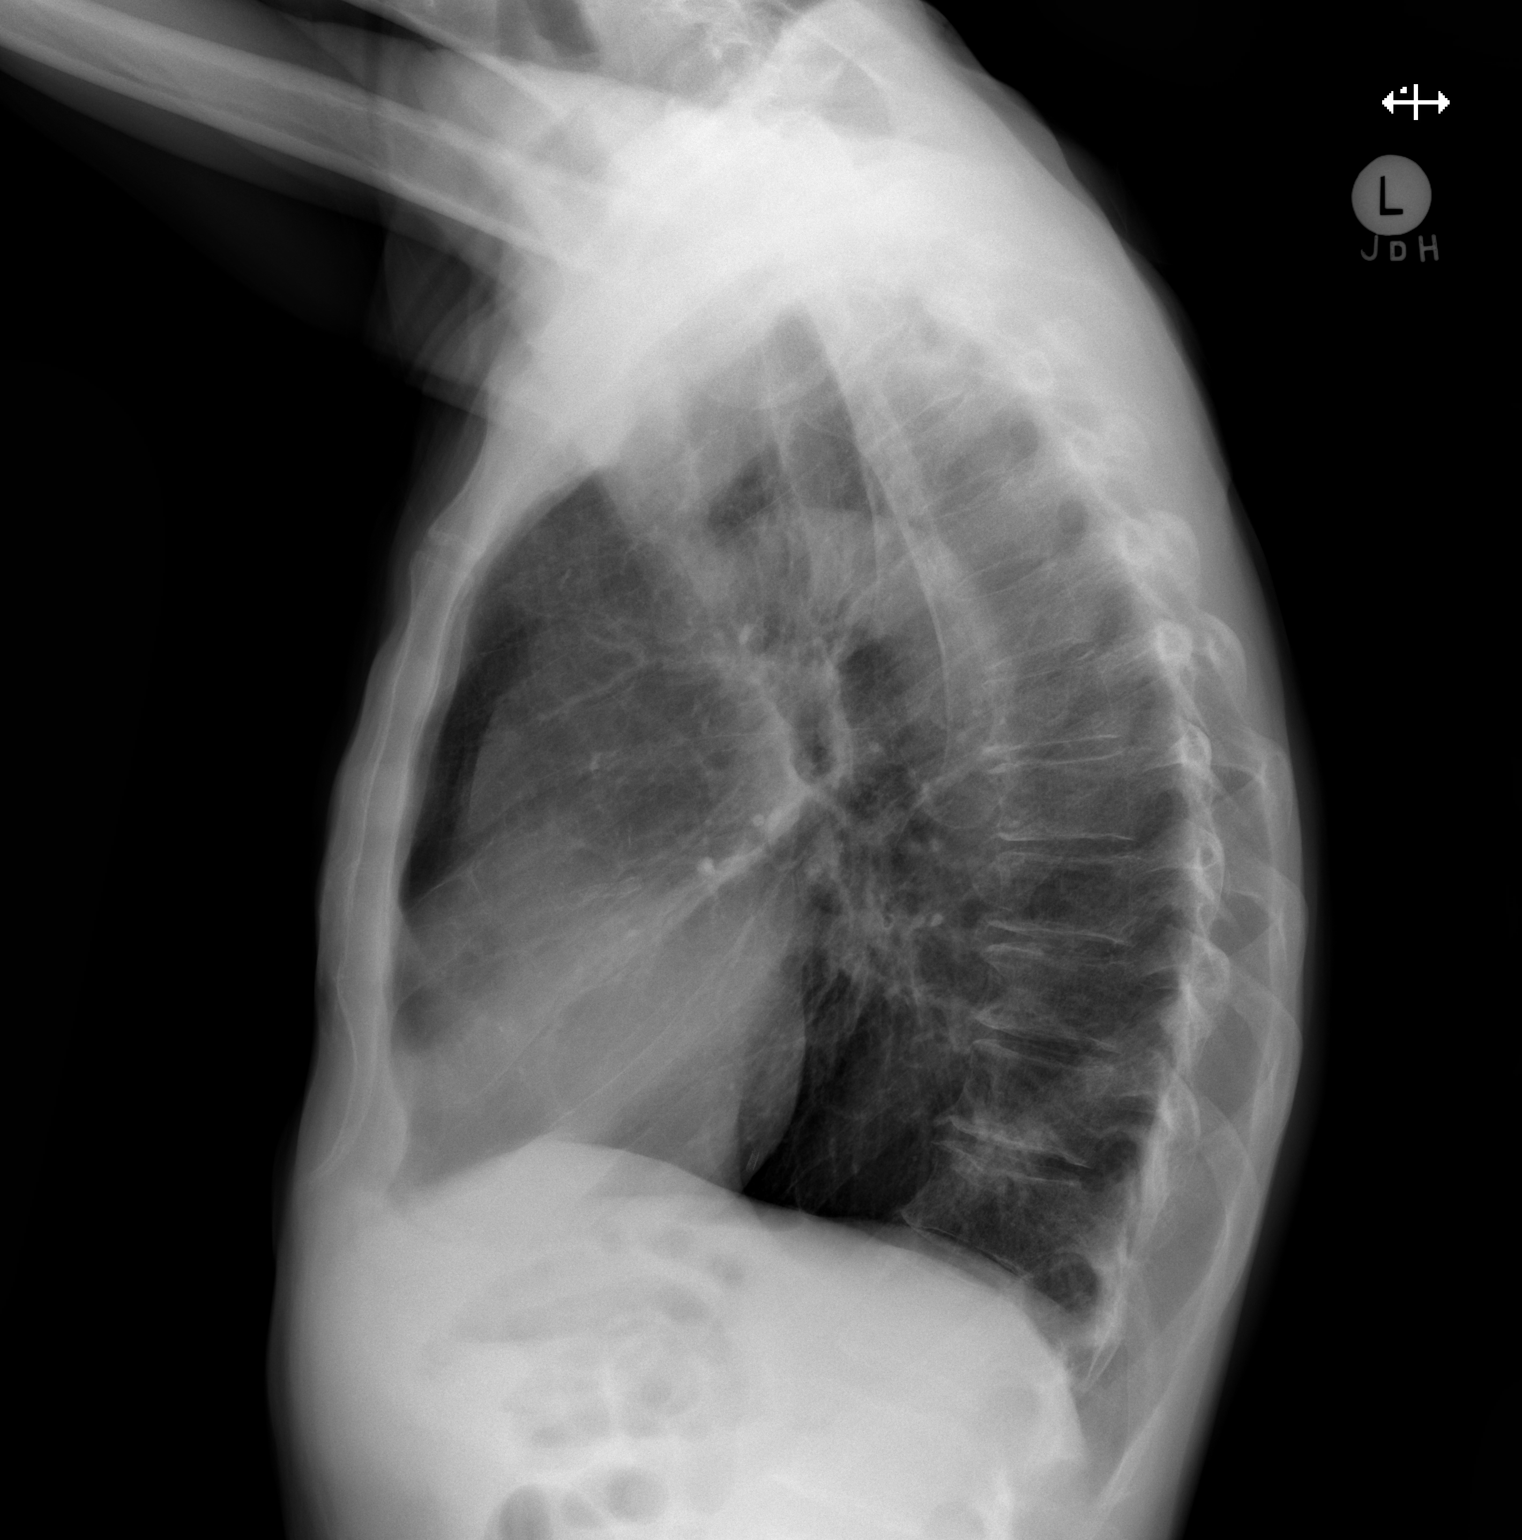

[2 of 2 positions shown; findings below may reference images not displayed]

FINDINGS: Normal mediastinum and cardiac silhouette. Normal pulmonary
vasculature. No evidence of effusion, infiltrate, or pneumothorax.

Fracture of the posterior RIGHT fourth rib.  This appears healed.
IMPRESSION: 1. No evidence of acute thoracic trauma.
2. No pneumothorax.
3. Posterior RIGHT fourth rib fracture appears healed.

## 2021-03-08 DIAGNOSIS — I6389 Other cerebral infarction: Secondary | ICD-10-CM
